# Patient Record
Sex: Female | Born: 1955
Health system: Southern US, Community
[De-identification: ages and names within clinical notes are randomized; demographics above are authoritative.]

## PROBLEM LIST (undated history)

## (undated) DIAGNOSIS — I10 Essential (primary) hypertension: Secondary | ICD-10-CM

## (undated) DIAGNOSIS — F419 Anxiety disorder, unspecified: Secondary | ICD-10-CM

## (undated) DIAGNOSIS — Z923 Personal history of irradiation: Secondary | ICD-10-CM

## (undated) DIAGNOSIS — C50919 Malignant neoplasm of unspecified site of unspecified female breast: Secondary | ICD-10-CM

## (undated) HISTORY — DX: Personal history of irradiation: Z92.3

## (undated) HISTORY — PX: TUBAL LIGATION: SHX77

## (undated) HISTORY — DX: Malignant neoplasm of unspecified site of unspecified female breast: C50.919

---

## 1998-10-12 ENCOUNTER — Other Ambulatory Visit: Admission: RE | Admit: 1998-10-12 | Discharge: 1998-10-12 | Payer: Self-pay | Admitting: Emergency Medicine

## 1999-12-02 ENCOUNTER — Encounter: Admission: RE | Admit: 1999-12-02 | Discharge: 1999-12-02 | Payer: Self-pay | Admitting: Emergency Medicine

## 1999-12-02 ENCOUNTER — Encounter: Payer: Self-pay | Admitting: Emergency Medicine

## 2000-02-15 ENCOUNTER — Other Ambulatory Visit: Admission: RE | Admit: 2000-02-15 | Discharge: 2000-02-15 | Payer: Self-pay | Admitting: Family Medicine

## 2001-01-04 ENCOUNTER — Encounter: Payer: Self-pay | Admitting: Emergency Medicine

## 2001-01-04 ENCOUNTER — Encounter: Admission: RE | Admit: 2001-01-04 | Discharge: 2001-01-04 | Payer: Self-pay | Admitting: Emergency Medicine

## 2001-07-05 ENCOUNTER — Encounter: Payer: Self-pay | Admitting: Emergency Medicine

## 2001-07-05 ENCOUNTER — Encounter: Admission: RE | Admit: 2001-07-05 | Discharge: 2001-07-05 | Payer: Self-pay | Admitting: Emergency Medicine

## 2001-11-14 ENCOUNTER — Other Ambulatory Visit: Admission: RE | Admit: 2001-11-14 | Discharge: 2001-11-14 | Payer: Self-pay | Admitting: Emergency Medicine

## 2002-02-05 ENCOUNTER — Encounter: Payer: Self-pay | Admitting: Emergency Medicine

## 2002-02-05 ENCOUNTER — Encounter: Admission: RE | Admit: 2002-02-05 | Discharge: 2002-02-05 | Payer: Self-pay | Admitting: Emergency Medicine

## 2002-02-28 HISTORY — PX: WISDOM TOOTH EXTRACTION: SHX21

## 2003-03-05 ENCOUNTER — Encounter: Admission: RE | Admit: 2003-03-05 | Discharge: 2003-03-05 | Payer: Self-pay | Admitting: Emergency Medicine

## 2003-04-16 ENCOUNTER — Other Ambulatory Visit: Admission: RE | Admit: 2003-04-16 | Discharge: 2003-04-16 | Payer: Self-pay | Admitting: Emergency Medicine

## 2004-03-25 ENCOUNTER — Encounter: Admission: RE | Admit: 2004-03-25 | Discharge: 2004-03-25 | Payer: Self-pay | Admitting: Emergency Medicine

## 2004-04-06 ENCOUNTER — Encounter: Admission: RE | Admit: 2004-04-06 | Discharge: 2004-04-06 | Payer: Self-pay | Admitting: Emergency Medicine

## 2004-04-08 ENCOUNTER — Encounter: Admission: RE | Admit: 2004-04-08 | Discharge: 2004-04-08 | Payer: Self-pay | Admitting: Surgery

## 2005-03-31 ENCOUNTER — Encounter: Admission: RE | Admit: 2005-03-31 | Discharge: 2005-03-31 | Payer: Self-pay | Admitting: Emergency Medicine

## 2005-05-04 ENCOUNTER — Other Ambulatory Visit: Admission: RE | Admit: 2005-05-04 | Discharge: 2005-05-04 | Payer: Self-pay | Admitting: Emergency Medicine

## 2005-12-15 ENCOUNTER — Encounter: Admission: RE | Admit: 2005-12-15 | Discharge: 2005-12-15 | Payer: Self-pay | Admitting: Emergency Medicine

## 2006-05-04 ENCOUNTER — Encounter: Admission: RE | Admit: 2006-05-04 | Discharge: 2006-05-04 | Payer: Self-pay | Admitting: Emergency Medicine

## 2006-07-11 ENCOUNTER — Other Ambulatory Visit: Admission: RE | Admit: 2006-07-11 | Discharge: 2006-07-11 | Payer: Self-pay | Admitting: Emergency Medicine

## 2007-04-13 ENCOUNTER — Other Ambulatory Visit: Admission: RE | Admit: 2007-04-13 | Discharge: 2007-04-13 | Payer: Self-pay | Admitting: Family Medicine

## 2007-05-08 ENCOUNTER — Encounter: Admission: RE | Admit: 2007-05-08 | Discharge: 2007-05-08 | Payer: Self-pay | Admitting: Emergency Medicine

## 2008-06-17 ENCOUNTER — Encounter: Admission: RE | Admit: 2008-06-17 | Discharge: 2008-06-17 | Payer: Self-pay | Admitting: Emergency Medicine

## 2008-12-26 ENCOUNTER — Other Ambulatory Visit: Admission: RE | Admit: 2008-12-26 | Discharge: 2008-12-26 | Payer: Self-pay | Admitting: Family Medicine

## 2009-07-16 ENCOUNTER — Encounter: Admission: RE | Admit: 2009-07-16 | Discharge: 2009-07-16 | Payer: Self-pay | Admitting: Family Medicine

## 2009-12-29 ENCOUNTER — Other Ambulatory Visit: Admission: RE | Admit: 2009-12-29 | Discharge: 2009-12-29 | Payer: Self-pay | Admitting: Family Medicine

## 2010-07-07 ENCOUNTER — Other Ambulatory Visit: Payer: Self-pay | Admitting: Family Medicine

## 2010-07-07 DIAGNOSIS — Z1231 Encounter for screening mammogram for malignant neoplasm of breast: Secondary | ICD-10-CM

## 2010-07-20 ENCOUNTER — Ambulatory Visit
Admission: RE | Admit: 2010-07-20 | Discharge: 2010-07-20 | Disposition: A | Discharge status: Home / Self Care | Payer: BC Managed Care – PPO | Source: Ambulatory Visit | Attending: Family Medicine | Admitting: Family Medicine

## 2010-07-20 DIAGNOSIS — Z1231 Encounter for screening mammogram for malignant neoplasm of breast: Secondary | ICD-10-CM

## 2010-07-21 ENCOUNTER — Other Ambulatory Visit: Payer: Self-pay | Admitting: Family Medicine

## 2010-07-21 DIAGNOSIS — R921 Mammographic calcification found on diagnostic imaging of breast: Secondary | ICD-10-CM

## 2010-07-23 ENCOUNTER — Ambulatory Visit
Admission: RE | Admit: 2010-07-23 | Discharge: 2010-07-23 | Disposition: A | Discharge status: Home / Self Care | Payer: BC Managed Care – PPO | Source: Ambulatory Visit | Attending: Family Medicine | Admitting: Family Medicine

## 2010-07-23 DIAGNOSIS — R921 Mammographic calcification found on diagnostic imaging of breast: Secondary | ICD-10-CM

## 2010-12-14 ENCOUNTER — Other Ambulatory Visit: Payer: Self-pay | Admitting: Family Medicine

## 2010-12-14 DIAGNOSIS — R921 Mammographic calcification found on diagnostic imaging of breast: Secondary | ICD-10-CM

## 2011-02-03 ENCOUNTER — Ambulatory Visit
Admission: RE | Admit: 2011-02-03 | Discharge: 2011-02-03 | Disposition: A | Discharge status: Home / Self Care | Payer: 59 | Source: Ambulatory Visit | Attending: Family Medicine | Admitting: Family Medicine

## 2011-02-03 DIAGNOSIS — R921 Mammographic calcification found on diagnostic imaging of breast: Secondary | ICD-10-CM

## 2011-06-24 ENCOUNTER — Other Ambulatory Visit: Payer: Self-pay | Admitting: Family Medicine

## 2011-06-24 DIAGNOSIS — Z1231 Encounter for screening mammogram for malignant neoplasm of breast: Secondary | ICD-10-CM

## 2011-07-28 ENCOUNTER — Ambulatory Visit
Admission: RE | Admit: 2011-07-28 | Discharge: 2011-07-28 | Disposition: A | Discharge status: Home / Self Care | Payer: 59 | Source: Ambulatory Visit | Attending: Family Medicine | Admitting: Family Medicine

## 2011-07-28 ENCOUNTER — Other Ambulatory Visit: Payer: Self-pay | Admitting: Family Medicine

## 2011-07-28 DIAGNOSIS — R921 Mammographic calcification found on diagnostic imaging of breast: Secondary | ICD-10-CM

## 2011-07-28 DIAGNOSIS — Z1231 Encounter for screening mammogram for malignant neoplasm of breast: Secondary | ICD-10-CM

## 2011-08-04 ENCOUNTER — Ambulatory Visit
Admission: RE | Admit: 2011-08-04 | Discharge: 2011-08-04 | Disposition: A | Discharge status: Home / Self Care | Payer: 59 | Source: Ambulatory Visit | Attending: Family Medicine | Admitting: Family Medicine

## 2011-08-04 DIAGNOSIS — R921 Mammographic calcification found on diagnostic imaging of breast: Secondary | ICD-10-CM

## 2011-09-27 ENCOUNTER — Other Ambulatory Visit: Payer: Self-pay | Admitting: Family Medicine

## 2011-09-27 ENCOUNTER — Other Ambulatory Visit (HOSPITAL_COMMUNITY)
Admission: RE | Admit: 2011-09-27 | Discharge: 2011-09-27 | Disposition: A | Discharge status: Home / Self Care | Payer: 59 | Source: Ambulatory Visit | Attending: Family Medicine | Admitting: Family Medicine

## 2011-09-27 DIAGNOSIS — Z1151 Encounter for screening for human papillomavirus (HPV): Secondary | ICD-10-CM | POA: Insufficient documentation

## 2011-09-27 DIAGNOSIS — Z124 Encounter for screening for malignant neoplasm of cervix: Secondary | ICD-10-CM | POA: Insufficient documentation

## 2012-02-29 DIAGNOSIS — Z923 Personal history of irradiation: Secondary | ICD-10-CM

## 2012-02-29 HISTORY — DX: Personal history of irradiation: Z92.3

## 2012-05-22 ENCOUNTER — Other Ambulatory Visit: Payer: Self-pay | Admitting: Family Medicine

## 2012-05-22 DIAGNOSIS — R921 Mammographic calcification found on diagnostic imaging of breast: Secondary | ICD-10-CM

## 2012-07-29 DIAGNOSIS — C50919 Malignant neoplasm of unspecified site of unspecified female breast: Secondary | ICD-10-CM

## 2012-07-29 HISTORY — DX: Malignant neoplasm of unspecified site of unspecified female breast: C50.919

## 2012-08-07 ENCOUNTER — Other Ambulatory Visit: Payer: Self-pay | Admitting: Family Medicine

## 2012-08-07 ENCOUNTER — Ambulatory Visit
Admission: RE | Admit: 2012-08-07 | Discharge: 2012-08-07 | Disposition: A | Discharge status: Home / Self Care | Payer: 59 | Source: Ambulatory Visit | Attending: Family Medicine | Admitting: Family Medicine

## 2012-08-07 DIAGNOSIS — R921 Mammographic calcification found on diagnostic imaging of breast: Secondary | ICD-10-CM

## 2012-08-08 ENCOUNTER — Other Ambulatory Visit: Payer: Self-pay | Admitting: Family Medicine

## 2012-08-08 ENCOUNTER — Ambulatory Visit
Admission: RE | Admit: 2012-08-08 | Discharge: 2012-08-08 | Disposition: A | Discharge status: Home / Self Care | Payer: 59 | Source: Ambulatory Visit | Attending: Family Medicine | Admitting: Family Medicine

## 2012-08-08 DIAGNOSIS — R921 Mammographic calcification found on diagnostic imaging of breast: Secondary | ICD-10-CM

## 2012-08-08 DIAGNOSIS — C50912 Malignant neoplasm of unspecified site of left female breast: Secondary | ICD-10-CM

## 2012-08-10 ENCOUNTER — Ambulatory Visit (INDEPENDENT_AMBULATORY_CARE_PROVIDER_SITE_OTHER): Payer: 59 | Admitting: Surgery

## 2012-08-10 ENCOUNTER — Encounter (INDEPENDENT_AMBULATORY_CARE_PROVIDER_SITE_OTHER): Payer: Self-pay | Admitting: Surgery

## 2012-08-10 ENCOUNTER — Other Ambulatory Visit (INDEPENDENT_AMBULATORY_CARE_PROVIDER_SITE_OTHER): Payer: Self-pay

## 2012-08-10 VITALS — BP 124/72 | HR 78 | Temp 98.4°F | Resp 15 | Ht 64.5 in | Wt 140.8 lb

## 2012-08-10 DIAGNOSIS — C50912 Malignant neoplasm of unspecified site of left female breast: Secondary | ICD-10-CM

## 2012-08-10 DIAGNOSIS — C50919 Malignant neoplasm of unspecified site of unspecified female breast: Secondary | ICD-10-CM

## 2012-08-10 NOTE — Patient Instructions (Signed)
I will talk with you again next Friday about the results of your other tests. We will put in a request for a genetic counselor to assess your risk to have a familial type of breast cancer

## 2012-08-10 NOTE — Progress Notes (Signed)
Patient ID: Karen Lawrence, female   DOB: 1955/08/11, 57 y.o.   MRN: [id number]  Chief Complaint  Patient presents with  . Breast Cancer    Left    HPI Karen Lawrence is a 57 y.o. female.  She had an area of increasingly suspicious calcifications in the left breast and a needle core biopsy has shown DCIS with calcifications. Receptor status is pending. The calcifications apparently her over about 11 cm area. MRI is pending. She is asymptomatic. She has a strong family history of breast cancer, and she does not believe anyone has been tested for genetic abnormalities thus far.  HPI  History reviewed. No pertinent past medical history.  History reviewed. No pertinent past surgical history.  Family History  Problem Relation Age of Onset  . Cancer Mother   . Cancer Father   . Diabetes Father   . Cancer Sister   . Cancer Sister   . Cancer Sister   . Diabetes Sister     Social History History  Substance Use Topics  . Smoking status: Never Smoker   . Smokeless tobacco: Never Used  . Alcohol Use: No    No Known Allergies  Current Outpatient Prescriptions  Medication Sig Dispense Refill  . triamterene-hydrochlorothiazide (MAXZIDE-25) 37.5-25 MG per tablet        No current facility-administered medications for this visit.    Review of Systems Review of Systems  Constitutional: Negative for fever, chills and unexpected weight change.  HENT: Negative for hearing loss, congestion, sore throat, trouble swallowing and voice change.   Eyes: Negative for visual disturbance.  Respiratory: Negative for cough and wheezing.   Cardiovascular: Negative for chest pain, palpitations and leg swelling.  Gastrointestinal: Negative for nausea, vomiting, abdominal pain, diarrhea, constipation, blood in stool, abdominal distention and anal bleeding.  Genitourinary: Negative for hematuria, vaginal bleeding and difficulty urinating.  Musculoskeletal: Negative for arthralgias.  Skin: Negative  for rash and wound.  Neurological: Negative for seizures, syncope and headaches.  Hematological: Negative for adenopathy. Does not bruise/bleed easily.  Psychiatric/Behavioral: Negative for confusion.    Blood pressure 124/72, pulse 78, temperature 98.4 F (36.9 C), temperature source Temporal, resp. rate 15, height 5' 4.5" (1.638 m), weight 140 lb 12.8 oz (63.866 kg).  Physical Exam Physical Exam  Vitals reviewed. Constitutional: She is oriented to person, place, and time. She appears well-developed and well-nourished. No distress.  HENT:  Head: Normocephalic and atraumatic.  Mouth/Throat: Oropharynx is clear and moist.  Eyes: Conjunctivae and EOM are normal. Pupils are equal, round, and reactive to light. No scleral icterus.  Neck: Normal range of motion. Neck supple. No tracheal deviation present. No thyromegaly present.  Cardiovascular: Normal rate, regular rhythm, normal heart sounds and intact distal pulses.  Exam reveals no gallop and no friction rub.   No murmur heard. Pulmonary/Chest: Effort normal and breath sounds normal. No respiratory distress. She has no wheezes. She has no rales.  Abdominal: Soft. Bowel sounds are normal. She exhibits no distension and no mass. There is no tenderness. There is no rebound and no guarding.  Musculoskeletal: Normal range of motion. She exhibits no edema and no tenderness.  Lymphadenopathy:    She has no cervical adenopathy.    She has no axillary adenopathy.       Right: No supraclavicular adenopathy present.       Left: No supraclavicular adenopathy present.  Neurological: She is alert and oriented to person, place, and time.  Skin: Skin is warm and dry.  No rash noted. She is not diaphoretic. No erythema.  Psychiatric: She has a normal mood and affect. Her behavior is normal. Judgment and thought content normal.    Data Reviewed Path: Diagnosis Breast, left, needle core biopsy, LOQ - DUCTAL CARCINOMA IN SITU WITH CALCIFICATIONS. -  SEE COMMENT. Microscopic Comment The carcinoma appears intermediate grade. Estrogen receptor and progesterone receptor studies will be performed and the results reported separately. The result were called to [facility] on 08/08/12. (JBK:gt, 08/08/12) Pecola Leisure MD Pathologist, Electronic  Assessment    Clinical stage 0 left breast cancer lower outer quadrant, MRI pending Strong family history for breast cancer (3 sisters, mother)     Plan    I have explained the pathophysiology and staging of breast cancer with particular attention to her exact situation. We discussed the multidisciplinary approach to breast cancer which often includes both medical and radiation oncology consultations.  We also discussed surgical options for the treatment of breast cancer including lumpectomy and mastectomy with possible reconstructive surgery. In addition we talked about the evaluation and management of lymph nodes including a description of sentinel lymph node biopsy and axillary dissections. We reviewed potential complications and risks including bleeding, infection, numbness,  lymphedema, and the potential need for additional surgery.  She understands that for patients who are candidate for lumpectomy or mastectomy there is an equal survival rate with either technique, but a slightly higher local recurrence rate with lumpectomy. In addition she knows that a lumpectomy usually requires postoperative radiation as part of the management of the breast cancer.  We have discussed the likely postoperative course and plans for followup.  I have given the patient some written information that reviewed all of these issues. I believe her questions are answered and that she has a good understanding of the issues.  Her MRI and prognostic indicator panel is still pending. She'll be presented at the breast cancer conference on Wednesday and I will see her on Friday when we have all the information  available. In the meantime, we will go ahead and make a genetic counselor appointment for genetic counseling.         Canaan Holzer J 08/10/2012, 9:04 AM

## 2012-08-10 NOTE — Addendum Note (Signed)
Addended byLiliana Cline on: 08/10/2012 09:41 AM   Modules accepted: Orders

## 2012-08-13 ENCOUNTER — Ambulatory Visit
Admission: RE | Admit: 2012-08-13 | Discharge: 2012-08-13 | Disposition: A | Discharge status: Home / Self Care | Payer: 59 | Source: Ambulatory Visit | Attending: Family Medicine | Admitting: Family Medicine

## 2012-08-13 DIAGNOSIS — C50912 Malignant neoplasm of unspecified site of left female breast: Secondary | ICD-10-CM

## 2012-08-13 MED ORDER — GADOBENATE DIMEGLUMINE 529 MG/ML IV SOLN
12.0000 mL | Freq: Once | INTRAVENOUS | Status: AC | PRN
  Administered 2012-08-13: 12 mL via INTRAVENOUS

## 2012-08-15 ENCOUNTER — Telehealth (INDEPENDENT_AMBULATORY_CARE_PROVIDER_SITE_OTHER): Payer: Self-pay | Admitting: General Surgery

## 2012-08-15 ENCOUNTER — Other Ambulatory Visit: Payer: Self-pay | Admitting: Family Medicine

## 2012-08-15 DIAGNOSIS — R928 Other abnormal and inconclusive findings on diagnostic imaging of breast: Secondary | ICD-10-CM

## 2012-08-15 NOTE — Telephone Encounter (Signed)
Patient called in crying and very upset with questions regarding her MRI. Patient stated Olegario Messier from the [facility] called her and gave her a results. I contacted Streck,MD and followed instructions to call patient back and relay the following: -the area seen on the MRI was the same area/same breast that was seen on the Mammogram. -area is about 4 inches away from other area. -just need to see if area is benign or malignant. -will sit down with patient and her Husband on 08/15/2012 at 12:30 to discuss plan. Streck,MD did ask the [facility] to not call patient today with results.

## 2012-08-17 ENCOUNTER — Ambulatory Visit (INDEPENDENT_AMBULATORY_CARE_PROVIDER_SITE_OTHER): Payer: 59 | Admitting: Surgery

## 2012-08-17 ENCOUNTER — Telehealth: Payer: Self-pay | Admitting: *Deleted

## 2012-08-17 ENCOUNTER — Other Ambulatory Visit (INDEPENDENT_AMBULATORY_CARE_PROVIDER_SITE_OTHER): Payer: Self-pay

## 2012-08-17 ENCOUNTER — Encounter (INDEPENDENT_AMBULATORY_CARE_PROVIDER_SITE_OTHER): Payer: Self-pay | Admitting: Surgery

## 2012-08-17 VITALS — BP 136/80 | HR 70 | Temp 98.4°F | Resp 15 | Ht 64.5 in | Wt 140.8 lb

## 2012-08-17 DIAGNOSIS — C50919 Malignant neoplasm of unspecified site of unspecified female breast: Secondary | ICD-10-CM

## 2012-08-17 DIAGNOSIS — Z803 Family history of malignant neoplasm of breast: Secondary | ICD-10-CM

## 2012-08-17 DIAGNOSIS — C50912 Malignant neoplasm of unspecified site of left female breast: Secondary | ICD-10-CM

## 2012-08-17 NOTE — Telephone Encounter (Signed)
Confirmed 08/20/12 genetic appt w/ pt.  Emailed Clydie Braun to make aware.  Emailed Jade at [facility] to make aware.

## 2012-08-17 NOTE — Patient Instructions (Signed)
We will followup with you once the report from next week biopsy is available. We are checking to see if we can expedite the appointment with the genetic counselor

## 2012-08-17 NOTE — Progress Notes (Signed)
Karen Lawrence       DOB: 1955-03-11           DATE: 08/17/2012       ZOX:[id number]  CC:  Chief Complaint  Patient presents with  . Follow-up    f/u breast ca mri    HPI: she comes back for counseling following her MRI and presentation at the multidisciplinary breast clinic. She has noticed some bruising at the area of her original needle biopsy. Her original biopsy showed DCIS. Her mammogram showed about 11 cm of calcifications suspicious for DCIS. The MRI shows 13 cm. She is very active in breast preservation and we need to do a biopsy to see the extent of the disease I discussed at length with her husband. We put in a request for a genetic counselor because she is such a strong family history and this may make our decision for surgery different. That has not been done and I discussed that with her again as well.  EXAM: Vital signs: BP 136/80  Pulse 70  Temp(Src) 98.4 F (36.9 C) (Temporal)  Resp 15  Ht 5' 4.5" (1.638 m)  Wt 140 lb 12.8 oz (63.866 kg)  BMI 23.8 kg/m2  General: Patient alert, oriented, NAD  Left breast: There is a small bruise in the lower outer quadrant from the biopsy. There is no other abnormality noted. IMP: clinical stage 0 left breast cancer lower outer quadrant and extending over a distance of about 13 cm; strong family history for breast cancer.  PLAN: I have spent a long time discussing the situation with the patient and her husband. She is scheduled for an MRI guided biopsy this coming Friday. We are also going to recheck on the headache counseling referral. Once we have this information we can make further plans. The patient has very large breasts and I think we could successfully excised a 12 or 13 cm area with acceptable cosmetic results. However, she has a genetic abnormality she may wish to consider mastectomy and reconstructions. I told her she will need radiation therapy with lumpectomy, but not with a mastectomy. I also told her she should not  need chemotherapy if final pathology confirms that there is no invasive breast cancer.  Shawntavia Saunders J 08/17/2012

## 2012-08-20 ENCOUNTER — Encounter: Payer: Self-pay | Admitting: Genetic Counselor

## 2012-08-20 ENCOUNTER — Other Ambulatory Visit: Payer: 59

## 2012-08-20 ENCOUNTER — Ambulatory Visit (HOSPITAL_BASED_OUTPATIENT_CLINIC_OR_DEPARTMENT_OTHER): Payer: 59 | Admitting: Genetic Counselor

## 2012-08-20 DIAGNOSIS — D0512 Intraductal carcinoma in situ of left breast: Secondary | ICD-10-CM

## 2012-08-20 DIAGNOSIS — D0590 Unspecified type of carcinoma in situ of unspecified breast: Secondary | ICD-10-CM

## 2012-08-20 DIAGNOSIS — IMO0002 Reserved for concepts with insufficient information to code with codable children: Secondary | ICD-10-CM

## 2012-08-20 DIAGNOSIS — Z803 Family history of malignant neoplasm of breast: Secondary | ICD-10-CM

## 2012-08-20 NOTE — Progress Notes (Signed)
Dr.  Cyndia Bent requested a consultation for genetic counseling and risk assessment for Karen Lawrence, a 56 y.o. female, for discussion of her personal history of breast cancer and family history of breast and prostate cancer.  She presents to clinic today to discuss the possibility of a genetic predisposition to cancer, and to further clarify her risks, as well as her family members' risks for cancer.   HISTORY OF PRESENT ILLNESS: In 2014, at the age of 61, Karen Lawrence was diagnosed with DCIS of the left breast. This is planning to be treated with lumpectomy and radiation.  The prognostic factors of the tumor are still pending.  Layan reports that two of her sisters were tested for "the gene" and did not have it.    Past Medical History  Diagnosis Date  . Breast cancer 07/2012    DCIS    History reviewed. No pertinent past surgical history.  History   Social History  . Marital Status: Married    Spouse Name: N/A    Number of Children: 2  . Years of Education: N/A   Social History Main Topics  . Smoking status: Never Smoker   . Smokeless tobacco: Never Used  . Alcohol Use: No  . Drug Use: No  . Sexually Active: None   Other Topics Concern  . None   Social History Narrative  . None    REPRODUCTIVE HISTORY AND PERSONAL RISK ASSESSMENT FACTORS: Menarche was at age 7.   postmenopausal Uterus Intact: yes Ovaries Intact: yes G2P2A0, first live birth at age 67  She has not previously undergone treatment for infertility.   Oral Contraceptive use: 8 years   She has not used HRT in the past.    FAMILY HISTORY:  We obtained a detailed, 4-generation family history.  Significant diagnoses are listed below: Family History  Problem Relation Age of Onset  . Breast cancer Mother 47  . Cancer Father   . Diabetes Father   . Breast cancer Sister 35  . Breast cancer Sister 55  . Breast cancer Sister 21  . Diabetes Sister   . Prostate cancer Brother 35  . HIV/AIDS  Brother   . Uterine cancer Paternal Aunt     dx in her mid 46s  The patient has 13 brothers and sisters.  One brother died in infancy, and one brother is a paternal half brother.  Three full sisters developed breast cancer at ages 41, 81 and 25.  One brother developed prostate cancer at 32.  The patient's mother had breast cancer at age 1 and died at 15.  Patient's maternal ancestors are of African American descent, and paternal ancestors are of African American descent. There is no reported Ashkenazi Jewish ancestry. There is no  known consanguinity.  GENETIC COUNSELING ASSESSMENT: [name] is a 57 y.o. female with a personal history of DCIS breast cancer and family history of breast and prostate cancer which somewhat suggestive of a hereditary breast and ovarian cancer syndrome and predisposition to cancer. She reports that her two youngest sisters, who are the most informative in the family to test as they developed breast cancer at the earliest ages, were negative when when they had genetic testing.  This decreases the likelihood that the patient will test positive. We, therefore, discussed and recommended the following at today's visit.   DISCUSSION: We reviewed the characteristics, features and inheritance patterns of hereditary cancer syndromes. We also discussed genetic testing, including the appropriate family members  to test, the process of testing, insurance coverage and turn-around-time for results. We recommended Karen Lawrence pursue genetic testing for multiple breast cancer genes at [facility], as she has multiple family members with breast cancer, two of whom have tested negative for, most likely, BRCA mutations. [name] only wanted to pursue genetic testing for BRCA mutations, and not a larger panel.  She realizes that there is a chance that we may not be testing for the right gene.  PLAN: After considering the risks, benefits, and limitations, Karen Lawrence provided  informed consent to pursue genetic testing and the blood sample will be sent to [facility] for analysis of the BRCA genes. We discussed the implications of a positive, negative and/ or variant of uncertain significance genetic test result. Results should be available within approximately 1-2 weeks' time, at which point they will be disclosed by telephone to Karen Lawrence, as will any additional her recommendations warranted by these results. Karen Lawrence will receive a summary of her genetic counseling visit and a copy of her results once available. This information will also be available in Epic. We encouraged Karen Lawrence to remain in contact with cancer genetics annually so that we can continuously update the family history and inform her of any changes in cancer genetics and testing that may be of benefit for her family. Karen Lawrence's questions were answered to her satisfaction today. Our contact information was provided should additional questions or concerns arise.  Per the patient's request, we will contact her by telephone to discuss these results. A follow up genetic counseling visit will be scheduled if indicated.  The patient was seen for a total of 60 minutes, greater than 50% of which was spent face-to-face counseling.  This plan is being carried out per Dr. Feliz Beam recommendations.  This note will also be sent to the referring provider via the electronic medical record. The patient will be supplied with a summary of this genetic counseling discussion as well as educational information on the discussed hereditary cancer syndromes following the conclusion of their visit.   Patient was discussed with Dr. Drue Second.   _______________________________________________________________________ For Office Staff:  Number of people involved in session: 1 Was an Intern/ student involved with case: no

## 2012-08-23 ENCOUNTER — Telehealth (INDEPENDENT_AMBULATORY_CARE_PROVIDER_SITE_OTHER): Payer: Self-pay

## 2012-08-23 NOTE — Telephone Encounter (Signed)
Pt called to notify [facility] / Dr Jamey Ripa that Dr. Azucena Kuba had changed her biopsy appt from June 27 to July 3.  Pt will contact Dr Azucena Kuba Monday July 7 as well as our office to "keep everyone on the same page"

## 2012-08-28 ENCOUNTER — Telehealth: Payer: Self-pay | Admitting: Genetic Counselor

## 2012-08-28 NOTE — Telephone Encounter (Signed)
Revealed negative genetic for BRCA genes

## 2012-08-29 ENCOUNTER — Encounter: Payer: Self-pay | Admitting: Genetic Counselor

## 2012-08-30 ENCOUNTER — Ambulatory Visit
Admission: RE | Admit: 2012-08-30 | Discharge: 2012-08-30 | Disposition: A | Discharge status: Home / Self Care | Payer: 59 | Source: Ambulatory Visit | Attending: Family Medicine | Admitting: Family Medicine

## 2012-08-30 DIAGNOSIS — R928 Other abnormal and inconclusive findings on diagnostic imaging of breast: Secondary | ICD-10-CM

## 2012-09-03 ENCOUNTER — Telehealth (INDEPENDENT_AMBULATORY_CARE_PROVIDER_SITE_OTHER): Payer: Self-pay | Admitting: General Surgery

## 2012-09-03 NOTE — Telephone Encounter (Signed)
I called patient - per Dr Jamey Ripa - to see if she would like him to call her to discuss 2nd biopsy results/surgery. I called patient and she would like to speak to him in person. Appt moved to Wednesday pm after the conference.

## 2012-09-03 NOTE — Telephone Encounter (Signed)
Karen Lawrence with [facility] called to let us know patient had her 2nd biopsy that was positive for DCIS - approximately 8.5 cm away from previous DCIS site in left breast. They are going to call patient with results today.

## 2012-09-04 ENCOUNTER — Other Ambulatory Visit (INDEPENDENT_AMBULATORY_CARE_PROVIDER_SITE_OTHER): Payer: Self-pay | Admitting: Surgery

## 2012-09-04 ENCOUNTER — Telehealth (INDEPENDENT_AMBULATORY_CARE_PROVIDER_SITE_OTHER): Payer: Self-pay | Admitting: Surgery

## 2012-09-04 DIAGNOSIS — C50912 Malignant neoplasm of unspecified site of left female breast: Secondary | ICD-10-CM

## 2012-09-04 NOTE — Telephone Encounter (Signed)
Called her to discuss bx report = it shows more DCIS and she will need a very large lumpectomy. That is what she wishes to do. Will try to schedule for next week and she will see me in the office tomorrow for further review and plans

## 2012-09-05 ENCOUNTER — Ambulatory Visit (INDEPENDENT_AMBULATORY_CARE_PROVIDER_SITE_OTHER): Payer: 59 | Admitting: Surgery

## 2012-09-05 ENCOUNTER — Encounter (HOSPITAL_COMMUNITY): Payer: Self-pay | Admitting: Respiratory Therapy

## 2012-09-05 ENCOUNTER — Encounter (INDEPENDENT_AMBULATORY_CARE_PROVIDER_SITE_OTHER): Payer: Self-pay | Admitting: Surgery

## 2012-09-05 VITALS — BP 130/82 | HR 88 | Temp 98.5°F | Resp 14 | Ht 64.5 in | Wt 139.4 lb

## 2012-09-05 DIAGNOSIS — C50919 Malignant neoplasm of unspecified site of unspecified female breast: Secondary | ICD-10-CM

## 2012-09-05 DIAGNOSIS — C50912 Malignant neoplasm of unspecified site of left female breast: Secondary | ICD-10-CM

## 2012-09-05 NOTE — Patient Instructions (Signed)
I will see you for surgery on Tuesday at [facility].

## 2012-09-05 NOTE — Progress Notes (Signed)
Karen Lawrence       DOB: [birth year]           DATE: 09/05/2012       WNU:[id number]  CC:  Chief Complaint  Patient presents with  . Breast Cancer    s/p 2nd biopsy    HPI: she comes back for further evaluation discussion following a second needle core biopsy of the left breast. Her mammogram showed a large area of calcifications extending from near the areola out laterally and posteriorly and inferiorly. A second biopsy was done at the lateral aspect is the first was done at the medial aspect and a second biopsy confirmed DCIS. She'll need an extensive lumpectomy to excise entire knee.  EXAM: Vital signs: BP 130/82  Pulse 88  Temp(Src) 98.5 F (36.9 C) (Temporal)  Resp 14  Ht 5' 4.5" (1.638 m)  Wt 139 lb 6.4 oz (63.231 kg)  BMI 23.57 kg/m2  General: Patient alert, oriented, NAD  Breasts: She has no evidence of hematoma from her more recent biopsy. IMP: Left breast cancer large area of DCIS  PLAN: We will proceed with NL lumpectomy which we discussed at length today. She will need to have the area bracketed. We discussed the potential of + margin and need for second excision and or mastectomy. We also discussed again the potential deformity of the breast. All questions answered. Outlined for her the extent of the incision and described the surgery in a fair amount of detail.  Irish Piech J 09/05/2012

## 2012-09-07 ENCOUNTER — Encounter (INDEPENDENT_AMBULATORY_CARE_PROVIDER_SITE_OTHER): Payer: 59 | Admitting: Surgery

## 2012-09-10 ENCOUNTER — Encounter (HOSPITAL_COMMUNITY)
Admission: RE | Admit: 2012-09-10 | Discharge: 2012-09-10 | Disposition: A | Discharge status: Home / Self Care | Payer: 59 | Source: Ambulatory Visit | Attending: Surgery | Admitting: Surgery

## 2012-09-10 ENCOUNTER — Encounter (HOSPITAL_COMMUNITY): Payer: Self-pay

## 2012-09-10 HISTORY — DX: Essential (primary) hypertension: I10

## 2012-09-10 LAB — COMPREHENSIVE METABOLIC PANEL
ALT: 26 U/L (ref 0–35)
AST: 30 U/L (ref 0–37)
Albumin: 4.3 g/dL (ref 3.5–5.2)
Alkaline Phosphatase: 59 U/L (ref 39–117)
BUN: 18 mg/dL (ref 6–23)
Chloride: 101 mEq/L (ref 96–112)
Potassium: 3.6 mEq/L (ref 3.5–5.1)
Sodium: 136 mEq/L (ref 135–145)
Total Bilirubin: 0.4 mg/dL (ref 0.3–1.2)

## 2012-09-10 LAB — CBC WITH DIFFERENTIAL/PLATELET
Basophils Relative: 0 % (ref 0–1)
Hemoglobin: 12.9 g/dL (ref 12.0–15.0)
MCHC: 33.9 g/dL (ref 30.0–36.0)
Monocytes Relative: 10 % (ref 3–12)
Neutro Abs: 2.6 10*3/uL (ref 1.7–7.7)
Neutrophils Relative %: 46 % (ref 43–77)
RBC: 4.25 MIL/uL (ref 3.87–5.11)

## 2012-09-10 MED ORDER — CEFAZOLIN SODIUM-DEXTROSE 2-3 GM-% IV SOLR
2.0000 g | INTRAVENOUS | Status: AC
  Administered 2012-09-11: 2 g via INTRAVENOUS
  Filled 2012-09-10: qty 50

## 2012-09-10 NOTE — Pre-Procedure Instructions (Signed)
KIMBLEY SPRAGUE  09/10/2012   Your procedure is scheduled on:  Tuesday, July 15  Report to [facility] at 0730 AM.  Call this number if you have problems the morning of surgery: [phone]   Remember:   Do not eat food or drink liquids after midnight Monday night.   Take these medicines the morning of surgery with A SIP OF WATER: none   Do not wear jewelry, make-up or nail polish.  Do not wear lotions, powders, or perfumes. You may wear deodorant.  Do not shave 48 hours prior to surgery.  Do not bring valuables to the hospital.  [facility] is not responsible  for any belongings or valuables.  Contacts, dentures or bridgework may not be worn into surgery.     Patients discharged the day of surgery will not be allowed to drive home.  Name and phone number of your driver:    Special Instructions: Shower using CHG 2 nights before surgery and the night before surgery.  If you shower the day of surgery use CHG.  Use special wash - you have one bottle of CHG for all showers.  You should use approximately 1/3 of the bottle for each shower.   Please read over the following fact sheets that you were given: Pain Booklet, Coughing and Deep Breathing and Surgical Site Infection Prevention

## 2012-09-11 ENCOUNTER — Ambulatory Visit
Admission: RE | Admit: 2012-09-11 | Discharge: 2012-09-11 | Disposition: A | Discharge status: Home / Self Care | Payer: 59 | Source: Ambulatory Visit | Attending: Surgery | Admitting: Surgery

## 2012-09-11 ENCOUNTER — Encounter (HOSPITAL_COMMUNITY): Payer: Self-pay | Admitting: Certified Registered"

## 2012-09-11 ENCOUNTER — Ambulatory Visit (HOSPITAL_COMMUNITY)
Admission: RE | Admit: 2012-09-11 | Discharge: 2012-09-11 | Disposition: A | Discharge status: Home / Self Care | Payer: 59 | Source: Ambulatory Visit | Attending: Surgery | Admitting: Surgery

## 2012-09-11 ENCOUNTER — Encounter (HOSPITAL_COMMUNITY)
Admission: RE | Disposition: A | Discharge status: Home / Self Care | Payer: Self-pay | Source: Ambulatory Visit | Attending: Surgery

## 2012-09-11 ENCOUNTER — Ambulatory Visit (HOSPITAL_COMMUNITY): Payer: 59 | Admitting: Certified Registered"

## 2012-09-11 DIAGNOSIS — C50912 Malignant neoplasm of unspecified site of left female breast: Secondary | ICD-10-CM

## 2012-09-11 DIAGNOSIS — D0590 Unspecified type of carcinoma in situ of unspecified breast: Secondary | ICD-10-CM

## 2012-09-11 DIAGNOSIS — I10 Essential (primary) hypertension: Secondary | ICD-10-CM | POA: Insufficient documentation

## 2012-09-11 DIAGNOSIS — Z79899 Other long term (current) drug therapy: Secondary | ICD-10-CM | POA: Insufficient documentation

## 2012-09-11 HISTORY — PX: BREAST LUMPECTOMY WITH NEEDLE LOCALIZATION: SHX5759

## 2012-09-11 SURGERY — BREAST LUMPECTOMY WITH NEEDLE LOCALIZATION
Anesthesia: General | Site: Breast | Laterality: Left | Wound class: Clean

## 2012-09-11 MED ORDER — OXYCODONE-ACETAMINOPHEN 5-325 MG PO TABS: 1.0000 | ORAL_TABLET | ORAL | Status: DC | PRN

## 2012-09-11 MED ORDER — ONDANSETRON HCL 4 MG/2ML IJ SOLN
INTRAMUSCULAR | Status: AC
  Filled 2012-09-11: qty 2

## 2012-09-11 MED ORDER — METOCLOPRAMIDE HCL 10 MG/10ML PO SOLN: 10.0000 mg | Freq: Three times a day (TID) | ORAL | Status: DC

## 2012-09-11 MED ORDER — ONDANSETRON HCL 4 MG/2ML IJ SOLN
INTRAMUSCULAR | Status: DC | PRN
  Administered 2012-09-11: 4 mg via INTRAVENOUS

## 2012-09-11 MED ORDER — METOCLOPRAMIDE HCL 5 MG/ML IJ SOLN
INTRAMUSCULAR | Status: AC
  Administered 2012-09-11: 10 mg
  Filled 2012-09-11: qty 2

## 2012-09-11 MED ORDER — LACTATED RINGERS IV SOLN
INTRAVENOUS | Status: DC
  Administered 2012-09-11 (×2): via INTRAVENOUS

## 2012-09-11 MED ORDER — BUPIVACAINE HCL (PF) 0.25 % IJ SOLN
INTRAMUSCULAR | Status: DC | PRN
  Administered 2012-09-11: 30 mL

## 2012-09-11 MED ORDER — CHLORHEXIDINE GLUCONATE 4 % EX LIQD: 1.0000 "application " | Freq: Once | CUTANEOUS | Status: DC

## 2012-09-11 MED ORDER — BUPIVACAINE HCL (PF) 0.25 % IJ SOLN
INTRAMUSCULAR | Status: AC
  Filled 2012-09-11: qty 30

## 2012-09-11 MED ORDER — OXYCODONE HCL 5 MG/5ML PO SOLN: 5.0000 mg | Freq: Once | ORAL | Status: DC | PRN

## 2012-09-11 MED ORDER — OXYCODONE HCL 5 MG PO TABS: 5.0000 mg | ORAL_TABLET | Freq: Once | ORAL | Status: DC | PRN

## 2012-09-11 MED ORDER — METOCLOPRAMIDE HCL 5 MG/ML IJ SOLN
10.0000 mg | Freq: Once | INTRAMUSCULAR | Status: AC | PRN
  Administered 2012-09-11: 10 mg via INTRAVENOUS

## 2012-09-11 MED ORDER — DEXAMETHASONE SODIUM PHOSPHATE 4 MG/ML IJ SOLN
INTRAMUSCULAR | Status: DC | PRN
  Administered 2012-09-11: 8 mg via INTRAVENOUS

## 2012-09-11 MED ORDER — PROPOFOL 10 MG/ML IV BOLUS
INTRAVENOUS | Status: DC | PRN
  Administered 2012-09-11: 200 mg via INTRAVENOUS

## 2012-09-11 MED ORDER — PHENYLEPHRINE HCL 10 MG/ML IJ SOLN
INTRAMUSCULAR | Status: DC | PRN
  Administered 2012-09-11 (×5): 80 ug via INTRAVENOUS

## 2012-09-11 MED ORDER — 0.9 % SODIUM CHLORIDE (POUR BTL) OPTIME
TOPICAL | Status: DC | PRN
  Administered 2012-09-11: 1000 mL

## 2012-09-11 MED ORDER — ONDANSETRON HCL 4 MG/2ML IJ SOLN
4.0000 mg | Freq: Once | INTRAMUSCULAR | Status: AC
  Administered 2012-09-11: 4 mg via INTRAVENOUS

## 2012-09-11 MED ORDER — HYDROMORPHONE HCL PF 1 MG/ML IJ SOLN
INTRAMUSCULAR | Status: AC
  Filled 2012-09-11: qty 1

## 2012-09-11 MED ORDER — LIDOCAINE HCL (CARDIAC) 20 MG/ML IV SOLN
INTRAVENOUS | Status: DC | PRN
  Administered 2012-09-11: 100 mg via INTRAVENOUS

## 2012-09-11 MED ORDER — FENTANYL CITRATE 0.05 MG/ML IJ SOLN
INTRAMUSCULAR | Status: DC | PRN
  Administered 2012-09-11: 25 ug via INTRAVENOUS
  Administered 2012-09-11: 50 ug via INTRAVENOUS
  Administered 2012-09-11: 75 ug via INTRAVENOUS

## 2012-09-11 MED ORDER — HYDROMORPHONE HCL PF 1 MG/ML IJ SOLN
0.2500 mg | INTRAMUSCULAR | Status: DC | PRN
  Administered 2012-09-11 (×2): 0.25 mg via INTRAVENOUS

## 2012-09-11 MED ORDER — MIDAZOLAM HCL 5 MG/5ML IJ SOLN
INTRAMUSCULAR | Status: DC | PRN
  Administered 2012-09-11: 2 mg via INTRAVENOUS

## 2012-09-11 MED ORDER — METOCLOPRAMIDE HCL 5 MG/ML IJ SOLN
INTRAMUSCULAR | Status: AC
  Filled 2012-09-11: qty 2

## 2012-09-11 MED ORDER — EPHEDRINE SULFATE 50 MG/ML IJ SOLN
INTRAMUSCULAR | Status: DC | PRN
  Administered 2012-09-11: 10 mg via INTRAVENOUS

## 2012-09-11 SURGICAL SUPPLY — 45 items
APPLIER CLIP 9.375 MED OPEN (MISCELLANEOUS)
APPLIER CLIP 9.375 SM OPEN (CLIP) ×2
APR CLP MED 9.3 20 MLT OPN (MISCELLANEOUS)
BINDER BREAST LRG (GAUZE/BANDAGES/DRESSINGS) IMPLANT
BINDER BREAST XLRG (GAUZE/BANDAGES/DRESSINGS) ×2 IMPLANT
BLADE SURG 15 STRL LF DISP TIS (BLADE) ×1 IMPLANT
BLADE SURG 15 STRL SS (BLADE) ×1
CANISTER SUCTION 2500CC (MISCELLANEOUS) ×2 IMPLANT
CHLORAPREP W/TINT 26ML (MISCELLANEOUS) ×2 IMPLANT
CLIP APPLIE 9.375 MED OPEN (MISCELLANEOUS) IMPLANT
CLIP APPLIE 9.375 SM OPEN (CLIP) ×1 IMPLANT
CLOTH BEACON ORANGE TIMEOUT ST (SAFETY) ×2 IMPLANT
COVER SURGICAL LIGHT HANDLE (MISCELLANEOUS) ×2 IMPLANT
DERMABOND ADVANCED (GAUZE/BANDAGES/DRESSINGS) ×1
DERMABOND ADVANCED .7 DNX12 (GAUZE/BANDAGES/DRESSINGS) ×1 IMPLANT
DEVICE DUBIN SPECIMEN MAMMOGRA (MISCELLANEOUS) IMPLANT
DRAPE CHEST BREAST 15X10 FENES (DRAPES) ×2 IMPLANT
ELECT CAUTERY BLADE 6.4 (BLADE) ×2 IMPLANT
ELECT REM PT RETURN 9FT ADLT (ELECTROSURGICAL) ×2
ELECTRODE REM PT RTRN 9FT ADLT (ELECTROSURGICAL) ×1 IMPLANT
GLOVE BIOGEL PI IND STRL 7.0 (GLOVE) ×2 IMPLANT
GLOVE BIOGEL PI INDICATOR 7.0 (GLOVE) ×2
GLOVE EUDERMIC 7 POWDERFREE (GLOVE) ×2 IMPLANT
GLOVE SURG SS PI 7.0 STRL IVOR (GLOVE) ×2 IMPLANT
GOWN STRL NON-REIN LRG LVL3 (GOWN DISPOSABLE) ×2 IMPLANT
GOWN STRL REIN XL XLG (GOWN DISPOSABLE) ×2 IMPLANT
KIT BASIN OR (CUSTOM PROCEDURE TRAY) ×2 IMPLANT
KIT MARKER MARGIN INK (KITS) IMPLANT
KIT ROOM TURNOVER OR (KITS) ×2 IMPLANT
NEEDLE HYPO 25GX1X1/2 BEV (NEEDLE) ×2 IMPLANT
NS IRRIG 1000ML POUR BTL (IV SOLUTION) ×2 IMPLANT
PACK SURGICAL SETUP 50X90 (CUSTOM PROCEDURE TRAY) ×2 IMPLANT
PAD ARMBOARD 7.5X6 YLW CONV (MISCELLANEOUS) ×2 IMPLANT
PENCIL BUTTON HOLSTER BLD 10FT (ELECTRODE) ×2 IMPLANT
SPONGE LAP 4X18 X RAY DECT (DISPOSABLE) ×2 IMPLANT
STAPLER VISISTAT 35W (STAPLE) ×2 IMPLANT
SUT MON AB 4-0 PC3 18 (SUTURE) ×2 IMPLANT
SUT SILK 2 0 SH (SUTURE) IMPLANT
SUT VIC AB 3-0 SH 18 (SUTURE) ×4 IMPLANT
SYR BULB 3OZ (MISCELLANEOUS) ×2 IMPLANT
SYR CONTROL 10ML LL (SYRINGE) ×2 IMPLANT
TOWEL OR 17X24 6PK STRL BLUE (TOWEL DISPOSABLE) ×2 IMPLANT
TOWEL OR 17X26 10 PK STRL BLUE (TOWEL DISPOSABLE) ×2 IMPLANT
TUBE CONNECTING 12X1/4 (SUCTIONS) ×2 IMPLANT
YANKAUER SUCT BULB TIP NO VENT (SUCTIONS) ×2 IMPLANT

## 2012-09-11 NOTE — Preoperative (Signed)
Beta Blockers   Reason not to administer Beta Blockers:Not Applicable 

## 2012-09-11 NOTE — Anesthesia Preprocedure Evaluation (Signed)
Anesthesia Evaluation  Patient identified by MRN, date of birth, ID band Patient awake    Reviewed: Allergy & Precautions, H&P , NPO status , Patient's Chart, lab work & pertinent test results, reviewed documented beta blocker date and time   Airway Mallampati: II TM Distance: >3 FB Neck ROM: full    Dental   Pulmonary neg pulmonary ROS,  breath sounds clear to auscultation        Cardiovascular hypertension, On Medications Rhythm:regular     Neuro/Psych negative neurological ROS  negative psych ROS   GI/Hepatic negative GI ROS, Neg liver ROS,   Endo/Other  negative endocrine ROS  Renal/GU negative Renal ROS  negative genitourinary   Musculoskeletal   Abdominal   Peds  Hematology negative hematology ROS (+)   Anesthesia Other Findings See surgeon's H&P   Reproductive/Obstetrics negative OB ROS                           Anesthesia Physical Anesthesia Plan  ASA: II  Anesthesia Plan: General   Post-op Pain Management:    Induction: Intravenous  Airway Management Planned: LMA  Additional Equipment:   Intra-op Plan:   Post-operative Plan:   Informed Consent: I have reviewed the patients History and Physical, chart, labs and discussed the procedure including the risks, benefits and alternatives for the proposed anesthesia with the patient or authorized representative who has indicated his/her understanding and acceptance.   Dental Advisory Given  Plan Discussed with: CRNA and Surgeon  Anesthesia Plan Comments:         Anesthesia Quick Evaluation  

## 2012-09-11 NOTE — Op Note (Signed)
KYANI SIMKIN Vanderweele [birth year] [id number] 09/05/2012  Preoperative diagnosis: left breast cancer, clinical stage 0, lower outer quadrant, approximately 11 cm  Postoperative diagnosis: same  Procedure: wire localized excision left breast cancer  Surgeon: Currie Paris, MD, FACS   Anesthesia: General   Clinical History and Indications: this patient had an area of calcifications that looked worrisome for DCIS and a needle core biopsy confirmed that. The area was 11 cm long and a second core biopsy at the lateral and confirmed that the entire area likely had DCIS as both biopsies were positive. She therefore elected a large lumpectomy although we did discuss the alternative of mastectomy with reconstruction.    Description of Procedure: I saw the patient a preoperative area and confirmed the plans, I marked the left breast to the operative side. I reviewed the wire localizing films which contained 2 wires to delineate the medial anterior and lateral posterior margins of the abnormality. Patient was then taken to the operating room and after satisfactory general anesthesia was obtained the left breast was prepped and draped in a time out performed.  The guidewires entered in the lower outer quadrant and tracks somewhat superior and medial. I made a radial oriented incision starting near the areolar margin and going laterally and inferiorly until I was beyond the more lateral placed wire.I elevated very thin skin flaps barely in the subdermal is area and then divided the breast tissue medially all the way down towards the chest wall. I then worked from the superior medial aspect of the incision to the inferior lateral taking a wide long deep area of breast tissue. I thought was well around all of the areas and both wires were in the specimen, although as I was manipulating the specimen the medial wire came out. Specimen mammogram showed both clips and the specimen. Try to assure negative margins, since  this was a large area of involvement, I took additional tissue from the inferior margin including a portion of the medial margin, the medial margin to include a portion of the superior margin, and a portion of the lateral margin to include a portion of the superior margin. I also took a small amount of the tissue that was left on the chest wall so it included the fascia for the deep margin.  I irrigated copiously, and made sure everything was dry. I infiltrated 30 cc of 0.25% plain Marcaine and to help with postop pain relief. I used small clips to mark all of the margins. I closed in layers with 3-0 Vicryl, 4-0 Monocryl subcuticular, and Dermabond.  The patient tolerated the procedure well. There were no complications. Counts were correct.estimated blood loss was minimal.  Currie Paris, MD, FACS 09/11/2012 11:21 AM

## 2012-09-11 NOTE — Transfer of Care (Signed)
Immediate Anesthesia Transfer of Care Note  Patient: Karen Lawrence  Procedure(s) Performed: Procedure(s): BREAST LUMPECTOMY WITH NEEDLE LOCALIZATION removal of left breast cancer  (Left)  Patient Location: PACU  Anesthesia Type:General  Level of Consciousness: awake, alert , oriented and patient cooperative  Airway & Oxygen Therapy: Patient Spontanous Breathing and Patient connected to nasal cannula oxygen  Post-op Assessment: Report given to PACU RN, Post -op Vital signs reviewed and stable and Patient moving all extremities  Post vital signs: Reviewed and stable  Complications: No apparent anesthesia complications

## 2012-09-11 NOTE — Interval H&P Note (Signed)
History and Physical Interval Note:  09/11/2012 9:17 AM  Karen Lawrence  has presented today for surgery, with the diagnosis of left breast cancer   The various methods of treatment have been discussed with the patient and family. After consideration of risks, benefits and other options for treatment, the patient has consented to  Procedure(s): BREAST LUMPECTOMY WITH NEEDLE LOCALIZATION removal of left breast cancer  (Left) as a surgical intervention .  The patient's history has been reviewed, patient examined, no change in status, stable for surgery.  I have reviewed the patient's chart and labs.  Questions were answered to the patient's satisfaction.     have reviewed the localizing films and marked the left breast as the operative side   Katrece Roediger J

## 2012-09-11 NOTE — H&P (View-Only) (Signed)
NAME: Karen Lawrence       DOB: 02/28/1955           DATE: 09/05/2012       MRN:[id number]  CC:  Chief Complaint  Patient presents with  . Breast Cancer    s/p 2nd biopsy    HPI: she comes back for further evaluation discussion following a second needle core biopsy of the left breast. Her mammogram showed a large area of calcifications extending from near the areola out laterally and posteriorly and inferiorly. A second biopsy was done at the lateral aspect is the first was done at the medial aspect and a second biopsy confirmed DCIS. She'll need an extensive lumpectomy to excise entire knee.  EXAM: Vital signs: BP 130/82  Pulse 88  Temp(Src) 98.5 F (36.9 C) (Temporal)  Resp 14  Ht 5' 4.5" (1.638 m)  Wt 139 lb 6.4 oz (63.231 kg)  BMI 23.57 kg/m2  General: Patient alert, oriented, NAD  Breasts: She has no evidence of hematoma from her more recent biopsy. IMP: Left breast cancer large area of DCIS  PLAN: We will proceed with NL lumpectomy which we discussed at length today. She will need to have the area bracketed. We discussed the potential of + margin and need for second excision and or mastectomy. We also discussed again the potential deformity of the breast. All questions answered. Outlined for her the extent of the incision and described the surgery in a fair amount of detail.  Lavere Stork J 09/05/2012   

## 2012-09-11 NOTE — Anesthesia Procedure Notes (Signed)
Procedure Name: LMA Insertion Date/Time: 09/11/2012 10:00 AM Performed by: Jerilee Hoh Pre-anesthesia Checklist: Patient identified, Emergency Drugs available, Suction available and Patient being monitored Patient Re-evaluated:Patient Re-evaluated prior to inductionOxygen Delivery Method: Circle system utilized Preoxygenation: Pre-oxygenation with 100% oxygen Intubation Type: IV induction LMA: LMA inserted LMA Size: 4.0 Number of attempts: 1 Placement Confirmation: positive ETCO2 and breath sounds checked- equal and bilateral Tube secured with: Tape Dental Injury: Teeth and Oropharynx as per pre-operative assessment

## 2012-09-11 NOTE — Anesthesia Postprocedure Evaluation (Signed)
Anesthesia Post Note  Patient: Karen Lawrence  Procedure(s) Performed: Procedure(s) (LRB): BREAST LUMPECTOMY WITH NEEDLE LOCALIZATION removal of left breast cancer  (Left)  Anesthesia type: General  Patient location: PACU  Post pain: Pain level controlled  Post assessment: Patient's Cardiovascular Status Stable  Last Vitals:  Filed Vitals:   09/11/12 1145  BP: 125/64  Pulse: 60  Temp:   Resp: 13    Post vital signs: Reviewed and stable  Level of consciousness: alert  Complications: No apparent anesthesia complications

## 2012-09-12 ENCOUNTER — Encounter (INDEPENDENT_AMBULATORY_CARE_PROVIDER_SITE_OTHER): Payer: Self-pay

## 2012-09-12 ENCOUNTER — Telehealth (INDEPENDENT_AMBULATORY_CARE_PROVIDER_SITE_OTHER): Payer: Self-pay | Admitting: General Surgery

## 2012-09-12 LAB — URINE CULTURE: Culture: NO GROWTH

## 2012-09-12 NOTE — Telephone Encounter (Signed)
Spoke with patient she is aware of  Po f/u appt on 10/05/12 at 12:10

## 2012-09-13 ENCOUNTER — Encounter (HOSPITAL_COMMUNITY): Payer: Self-pay | Admitting: Surgery

## 2012-09-14 ENCOUNTER — Telehealth (INDEPENDENT_AMBULATORY_CARE_PROVIDER_SITE_OTHER): Payer: Self-pay | Admitting: Surgery

## 2012-09-14 NOTE — Telephone Encounter (Signed)
I called her about her path today. She says she is doing well. Discussed with her that all is DCIS, no invasive present. Margins are negative, but anterior is close.   I told her we will request a radiation oncology consult and a medical oncologist as well, but she won't need chemo since this is all non-invasive.

## 2012-09-17 ENCOUNTER — Other Ambulatory Visit (INDEPENDENT_AMBULATORY_CARE_PROVIDER_SITE_OTHER): Payer: Self-pay

## 2012-09-17 DIAGNOSIS — C50912 Malignant neoplasm of unspecified site of left female breast: Secondary | ICD-10-CM

## 2012-09-17 NOTE — Addendum Note (Signed)
Addended byLiliana Cline on: 09/17/2012 10:37 AM   Modules accepted: Orders

## 2012-09-19 ENCOUNTER — Telehealth (INDEPENDENT_AMBULATORY_CARE_PROVIDER_SITE_OTHER): Payer: Self-pay

## 2012-09-19 NOTE — Telephone Encounter (Signed)
Pt calling b/c no one has contacted her about the medical and radiation oncology appt's . I advised pt that the referrals have been sent to [facility] and radiation. As I was talking to the pt in the nursing office Penni Bombard overheard the pt's name and said she already sent Dawn/Lisa staff messages about the pt not having appt's yet. I advised pt that if she still does not hear from the [facility] to call us back to let us know. The pt understands.

## 2012-09-21 ENCOUNTER — Telehealth (INDEPENDENT_AMBULATORY_CARE_PROVIDER_SITE_OTHER): Payer: Self-pay | Admitting: General Surgery

## 2012-09-21 ENCOUNTER — Telehealth: Payer: Self-pay | Admitting: *Deleted

## 2012-09-21 ENCOUNTER — Telehealth: Payer: Self-pay | Admitting: Genetic Counselor

## 2012-09-21 DIAGNOSIS — C50919 Malignant neoplasm of unspecified site of unspecified female breast: Secondary | ICD-10-CM

## 2012-09-21 NOTE — Telephone Encounter (Signed)
Returned patient's call, but there was no answer.  Left VM message that I had tried to return call, and to please call back.

## 2012-09-21 NOTE — Telephone Encounter (Signed)
Gave pt appt for consult with Dr. Welton Flakes on 10/01/12 at 1:15 and 1245 for labs.  Received verbal confirmation.  Pt denies further needs at this time.  Contact information given.

## 2012-09-21 NOTE — Telephone Encounter (Signed)
Message copied by Liliana Cline on Fri Sep 21, 2012  8:22 AM ------      Message from: Marianne Sofia C      Created: Thu Sep 20, 2012  5:32 PM      Regarding: RE: med onc appt       Do you mind putting a referral in for med onc?      We have one for rad onc and genetic.            Thanks,      Dawn            ----- Message -----         From: Littie Deeds         Sent: 09/18/2012   3:13 PM           To: Lysbeth Penner, RN      Subject: RE: med onc appt                                         Yes she needs to see both med onc and rad onc.  According to Dr. Jamey Ripa and his nurse.            Thanks for your help.            Penni Bombard      ----- Message -----         From: Lysbeth Penner, RN         Sent: 09/17/2012   4:48 PM           To: Littie Deeds      Subject: med onc appt                                             Leonarda Salon,            Does Ms. Craton need to see both med and rad onc?            Thanks,      Duwaine Maxin asked if we received a referral and I didn't see one.      Just let me know.                   ------

## 2012-09-21 NOTE — Telephone Encounter (Signed)
Patient called to see where her letter and results were.  I confirmed with her that they were a stack to be signed and we will try to get that signed and sent out next week.

## 2012-10-01 ENCOUNTER — Other Ambulatory Visit: Payer: Self-pay | Admitting: *Deleted

## 2012-10-01 ENCOUNTER — Encounter: Payer: Self-pay | Admitting: Oncology

## 2012-10-01 ENCOUNTER — Ambulatory Visit (HOSPITAL_BASED_OUTPATIENT_CLINIC_OR_DEPARTMENT_OTHER): Payer: 59

## 2012-10-01 ENCOUNTER — Ambulatory Visit (HOSPITAL_BASED_OUTPATIENT_CLINIC_OR_DEPARTMENT_OTHER): Payer: 59 | Admitting: Oncology

## 2012-10-01 ENCOUNTER — Other Ambulatory Visit (HOSPITAL_BASED_OUTPATIENT_CLINIC_OR_DEPARTMENT_OTHER): Payer: 59

## 2012-10-01 ENCOUNTER — Telehealth: Payer: Self-pay | Admitting: *Deleted

## 2012-10-01 VITALS — BP 166/84 | HR 67 | Temp 98.4°F | Resp 20 | Ht 64.5 in | Wt 140.2 lb

## 2012-10-01 DIAGNOSIS — I10 Essential (primary) hypertension: Secondary | ICD-10-CM

## 2012-10-01 DIAGNOSIS — D0512 Intraductal carcinoma in situ of left breast: Secondary | ICD-10-CM

## 2012-10-01 DIAGNOSIS — D0590 Unspecified type of carcinoma in situ of unspecified breast: Secondary | ICD-10-CM

## 2012-10-01 DIAGNOSIS — Z170 Estrogen receptor positive status [ER+]: Secondary | ICD-10-CM

## 2012-10-01 DIAGNOSIS — C50912 Malignant neoplasm of unspecified site of left female breast: Secondary | ICD-10-CM

## 2012-10-01 LAB — CBC WITH DIFFERENTIAL/PLATELET
BASO%: 0.7 % (ref 0.0–2.0)
Eosinophils Absolute: 0.1 10*3/uL (ref 0.0–0.5)
HCT: 36.1 % (ref 34.8–46.6)
LYMPH%: 32.2 % (ref 14.0–49.7)
MCHC: 32.7 g/dL (ref 31.5–36.0)
MCV: 91 fL (ref 79.5–101.0)
MONO#: 0.6 10*3/uL (ref 0.1–0.9)
MONO%: 12.4 % (ref 0.0–14.0)
NEUT%: 52.5 % (ref 38.4–76.8)
Platelets: 205 10*3/uL (ref 145–400)
WBC: 4.7 10*3/uL (ref 3.9–10.3)

## 2012-10-01 LAB — COMPREHENSIVE METABOLIC PANEL (CC13)
Alkaline Phosphatase: 59 U/L (ref 40–150)
CO2: 25 mEq/L (ref 22–29)
Creatinine: 0.9 mg/dL (ref 0.6–1.1)
Glucose: 82 mg/dl (ref 70–140)
Total Bilirubin: 0.5 mg/dL (ref 0.20–1.20)

## 2012-10-01 NOTE — Telephone Encounter (Signed)
Pt is aware that i emailed kk to advise me on a time slot. Per pof kk wants to see the pt in 1 month, however her first opening is 10.3.14. Pt is aware that i will call her w/ an appt d/t as soon as kk respond....td

## 2012-10-01 NOTE — Patient Instructions (Addendum)
We discussed your diagnosis and treatment  Keep your appointment with Dr. Basilio Cairo  Tamoxifen oral tablet What is this medicine? TAMOXIFEN (ta MOX i fen) blocks the effects of estrogen. It is commonly used to treat breast cancer. It is also used to decrease the chance of breast cancer coming back in women who have received treatment for the disease. It may also help prevent breast cancer in women who have a high risk of developing breast cancer. This medicine may be used for other purposes; ask your health care provider or pharmacist if you have questions. What should I tell my health care provider before I take this medicine? They need to know if you have any of these conditions: -blood clots -blood disease -cataracts or impaired eyesight -endometriosis -high calcium levels -high cholesterol -irregular menstrual cycles -liver disease -stroke -uterine fibroids -an unusual or allergic reaction to tamoxifen, other medicines, foods, dyes, or preservatives -pregnant or trying to get pregnant -breast-feeding How should I use this medicine? Take this medicine by mouth with a glass of water. Follow the directions on the prescription label. You can take it with or without food. Take your medicine at regular intervals. Do not take your medicine more often than directed. Do not stop taking except on your doctor's advice. A special MedGuide will be given to you by the pharmacist with each prescription and refill. Be sure to read this information carefully each time. Talk to your pediatrician regarding the use of this medicine in children. While this drug may be prescribed for selected conditions, precautions do apply. Overdosage: If you think you have taken too much of this medicine contact a poison control center or emergency room at once. NOTE: This medicine is only for you. Do not share this medicine with others. What if I miss a dose? If you miss a dose, take it as soon as you can. If it is  almost time for your next dose, take only that dose. Do not take double or extra doses. What may interact with this medicine? -aminoglutethimide -bromocriptine -chemotherapy drugs -female hormones, like estrogens and birth control pills -letrozole -medroxyprogesterone -phenobarbital -rifampin -warfarin This list may not describe all possible interactions. Give your health care provider a list of all the medicines, herbs, non-prescription drugs, or dietary supplements you use. Also tell them if you smoke, drink alcohol, or use illegal drugs. Some items may interact with your medicine. What should I watch for while using this medicine? Visit your doctor or health care professional for regular checks on your progress. You will need regular pelvic exams, breast exams, and mammograms. If you are taking this medicine to reduce your risk of getting breast cancer, you should know that this medicine does not prevent all types of breast cancer. If breast cancer or other problems occur, there is no guarantee that it will be found at an early stage. Do not become pregnant while taking this medicine or for 2 months after stopping this medicine. Stop taking this medicine if you get pregnant or think you are pregnant and contact your doctor. This medicine may harm your unborn baby. Women who can possibly become pregnant should use birth control methods that do not use hormones during tamoxifen treatment and for 2 months after therapy has stopped. Talk with your health care provider for birth control advice. Do not breast feed while taking this medicine. What side effects may I notice from receiving this medicine? Side effects that you should report to your doctor or health care professional  as soon as possible: -changes in vision (blurred vision) -changes in your menstrual cycle -difficulty breathing or shortness of breath -difficulty walking or talking -new breast lumps -numbness -pelvic pain or  pressure -redness, blistering, peeling or loosening of the skin, including inside the mouth -skin rash or itching (hives) -sudden chest pain -swelling of lips, face, or tongue -swelling, pain or tenderness in your calf or leg -unusual bruising or bleeding -vaginal discharge that is bloody, brown, or rust -weakness -yellowing of the whites of the eyes or skin Side effects that usually do not require medical attention (report to your doctor or health care professional if they continue or are bothersome): -fatigue -hair loss, although uncommon and is usually mild -headache -hot flashes -impotence (in men) -nausea, vomiting (mild) -vaginal discharge (white or clear) This list may not describe all possible side effects. Call your doctor for medical advice about side effects. You may report side effects to FDA at 1-800-FDA-1088. Where should I keep my medicine? Keep out of the reach of children. Store at room temperature between 20 and 25 degrees C (68 and 77 degrees F). Protect from light. Keep container tightly closed. Throw away any unused medicine after the expiration date. NOTE: This sheet is a summary. It may not cover all possible information. If you have questions about this medicine, talk to your doctor, pharmacist, or health care provider.  2012, [employer]. (11/01/2007 12:01:56 PM)  Anastrozole tablets What is this medicine? ANASTROZOLE (an AS troe zole) is used to treat breast cancer in women who have gone through menopause. Some types of breast cancer depend on estrogen to grow, and this medicine can stop tumor growth by blocking estrogen production. This medicine may be used for other purposes; ask your health care provider or pharmacist if you have questions. What should I tell my health care provider before I take this medicine? They need to know if you have any of these conditions: -liver disease -an unusual or allergic reaction to anastrozole, other medicines,  foods, dyes, or preservatives -pregnant or trying to get pregnant -breast-feeding How should I use this medicine? Take this medicine by mouth with a glass of water. Follow the directions on the prescription label. You can take this medicine with or without food. Take your doses at regular intervals. Do not take your medicine more often than directed. Do not stop taking except on the advice of your doctor or health care professional. Talk to your pediatrician regarding the use of this medicine in children. Special care may be needed. Overdosage: If you think you have taken too much of this medicine contact a poison control center or emergency room at once. NOTE: This medicine is only for you. Do not share this medicine with others. What if I miss a dose? If you miss a dose, take it as soon as you can. If it is almost time for your next dose, take only that dose. Do not take double or extra doses. What may interact with this medicine? Do not take this medicine with any of the following medications: -female hormones, like estrogens or progestins and birth control pills This medicine may also interact with the following medications: -tamoxifen This list may not describe all possible interactions. Give your health care provider a list of all the medicines, herbs, non-prescription drugs, or dietary supplements you use. Also tell them if you smoke, drink alcohol, or use illegal drugs. Some items may interact with your medicine. What should I watch for while using this medicine?  Visit your doctor or health care professional for regular checks on your progress. Let your doctor or health care professional know about any unusual vaginal bleeding. Do not treat yourself for diarrhea, nausea, vomiting or other side effects. Ask your doctor or health care professional for advice. What side effects may I notice from receiving this medicine? Side effects that you should report to your doctor or health care  professional as soon as possible: -allergic reactions like skin rash, itching or hives, swelling of the face, lips, or tongue -any new or unusual symptoms -breathing problems -chest pain -leg pain or swelling -vomiting Side effects that usually do not require medical attention (report to your doctor or health care professional if they continue or are bothersome): -back or bone pain -cough, or throat infection -diarrhea or constipation -dizziness -headache -hot flashes -loss of appetite -nausea -sweating -weakness and tiredness -weight gain This list may not describe all possible side effects. Call your doctor for medical advice about side effects. You may report side effects to FDA at 1-800-FDA-1088. Where should I keep my medicine? Keep out of the reach of children. Store at room temperature between 20 and 25 degrees C (68 and 77 degrees F). Throw away any unused medicine after the expiration date. NOTE: This sheet is a summary. It may not cover all possible information. If you have questions about this medicine, talk to your doctor, pharmacist, or health care provider.  2013, [employer] Standard. (04/27/2007 4:31:52 PM)

## 2012-10-01 NOTE — Progress Notes (Signed)
Checked in new patient. I gave her the [facility] packet. She wants mail and phone only. Didn't ask if living will/POA.

## 2012-10-01 NOTE — Progress Notes (Signed)
Karen Lawrence [id number] [birth year] 57 y.o. 10/01/2012 2:14 PM  CC  Emeterio Reeve, MD [address] [place] [address] Dr. Cyndia Bent Dr. Lonie Peak  REASON FOR CONSULTATION:  57 year old female with new diagnosis of DCIS of the left breast  STAGE:   No matching staging information was found for the patient.  REFERRING PHYSICIAN: Dr. Cyndia Bent  HISTORY OF PRESENT ILLNESS:  Karen Lawrence is a 57 y.o. female.  Who presented 4 AM you'll mammogram in June 2014. This revealed a 11 cm area of suspicious calcifications in the left breast. Biopsy performed on June 10 Beal DCIS that was ER +100% PR +70%. MRI of the breasts in the left breast revealed in the anterior third of the lateral breast postbiopsy changes and signal void artifact. Patchy non-masslike enhancement throughout the lower outer quadrant of the left breast measuring 13.1 cm in anterior-posterior transverse and longitudinal dimensions. No adenopathy. Patient had additional biopsies on 08/30/2012 which again revealed DCIS that was ER positive.  Patient underwent a large lumpectomy on 09/11/2012. Final pathology did reveala 0.5 cm DCIS intermediate grade. Tumor was ER +100% PR +51%. Final staging Tis NX. Postoperatively she is doing well without any   Past Medical History: Past Medical History  Diagnosis Date  . Breast cancer 07/2012    DCIS  . Hypertension     Past Surgical History: Past Surgical History  Procedure Laterality Date  . Tubal ligation    . Wisdom tooth extraction  2004  . Breast lumpectomy with needle localization Left 09/11/2012    Procedure: BREAST LUMPECTOMY WITH NEEDLE LOCALIZATION removal of left breast cancer ;  Surgeon: Currie Paris, MD;  Location: [facility] OR;  Service: General;  Laterality: Left;    Family History: Family History  Problem Relation Age of Onset  . Breast cancer Mother 104  . Cancer Father   . Diabetes Father   . Breast cancer Sister 80  .  Breast cancer Sister 75  . Breast cancer Sister 4  . Diabetes Sister   . Prostate cancer Brother 1  . HIV/AIDS Brother   . Uterine cancer Paternal Aunt     dx in her mid 32s    Social History History  Substance Use Topics  . Smoking status: Never Smoker   . Smokeless tobacco: Never Used  . Alcohol Use: No    Allergies: No Known Allergies  Current Medications: Current Outpatient Prescriptions  Medication Sig Dispense Refill  . cholecalciferol (VITAMIN D) 1000 UNITS tablet Take 1,000 Units by mouth daily.      . IODINE, KELP, PO Take by mouth daily.      . Multiple Vitamin (MULTIVITAMIN WITH MINERALS) TABS Take 1 tablet by mouth daily.      Marland Kitchen triamterene-hydrochlorothiazide (MAXZIDE-25) 37.5-25 MG per tablet Take 1 tablet by mouth daily. Pt taking 1/2 tablet      . vitamin C (ASCORBIC ACID) 500 MG tablet Take 500 mg by mouth daily.      Marland Kitchen oxyCODONE-acetaminophen (ROXICET) 5-325 MG per tablet Take 1 tablet by mouth every 4 (four) hours as needed for pain.  30 tablet  0   No current facility-administered medications for this visit.    OB/GYN History:  Fertility Discussion: n/a Prior History of Cancer: no  Health Maintenance:  Colonoscopy  Bone Density Last PAP smear  ECOG PERFORMANCE STATUS: 0 - Asymptomatic  Genetic Counseling/testing: no  REVIEW OF SYSTEMS:  A comprehensive review of systems was negative.  PHYSICAL EXAMINATION: Blood  pressure 166/84, pulse 67, temperature 98.4 F (36.9 C), temperature source Oral, resp. rate 20, height 5' 4.5" (1.638 m), weight 140 lb 3.2 oz (63.594 kg).  ZOX:WRUEA, healthy, no distress, well nourished and well developed SKIN: skin color, texture, turgor are normal HEAD: Normocephalic EYES: PERRLA, EOMI EARS: External ears normal OROPHARYNX:no exudate and no erythema  NECK: supple, no adenopathy LYMPH:  no palpable lymphadenopathy BREAST:surgical scars noted in the lef breast LUNGS: clear to auscultation  HEART:  regular rate & rhythm ABDOMEN:abdomen soft, non-tender, normal bowel sounds and no masses or organomegaly BACK: Back symmetric, no curvature. EXTREMITIES:no edema, no clubbing, no cyanosis  NEURO: alert & oriented x 3 with fluent speech, no focal motor/sensory deficits     STUDIES/RESULTS: Chest 2 View  09/10/2012   *RADIOLOGY REPORT*  Clinical Data: Left-sided breast cancer.  CHEST - 2 VIEW  Comparison: None.  Findings: Heart size is normal.  The lungs are clear.  The visualized soft tissues and bony thorax are unremarkable.  IMPRESSION: Negative two-view chest radiograph.   Original Report Authenticated By: Marin Roberts, M.D.   Mm Lt Plc Breast Loc Dev   1st Lesion  Inc Mammo Guide  09/11/2012   *RADIOLOGY REPORT*  Clinical Data: Patient with left breast DCIS  NEEDLE LOCALIZATION WITH MAMMOGRAPHIC GUIDANCE AND SPECIMEN RADIOGRAPH  Comparison:  Priors including 08/30/2012  Patient presents for needle localization prior to lumpectomy. I met with the patient and we discussed the procedure of needle localization including benefits and alternatives. We discussed the high likelihood of a successful procedure. We discussed the risks of the procedure, including infection, bleeding, tissue injury, and further surgery. Informed, written consent was given. The usual time-out protocol was performed immediately prior to the procedure.  Using mammographic guidance, sterile technique, 2% lidocaine and a 7 cm modified Kopans needle the posterior extent of the calcifications and posterior biopsy marking clip within the lateral left breast were localized using a lateral approach.  Additionally the anterior extent of the calcifications and anterior biopsy marking clip were localized using lateral approach and a 5 cm Kopans wire.  The films are marked for Dr. Jamey Ripa. Specimen radiograph was performed at Day Surgery and confirms the anterior and posterior biopsy marking clips as well as the posterior Kopans wire  to be present in the tissue sample.  The specimen is marked for pathology.  IMPRESSION: Needle localization of calcifications and biopsy marking clips in the lateral left breast.  No apparent complications.   Original Report Authenticated By: Annia Belt, M.D     LABS:    Chemistry      Component Value Date/Time   NA 141 10/01/2012 1245   NA 136 09/10/2012 1236   K 4.1 10/01/2012 1245   K 3.6 09/10/2012 1236   CL 101 09/10/2012 1236   CO2 25 10/01/2012 1245   CO2 25 09/10/2012 1236   BUN 13.1 10/01/2012 1245   BUN 18 09/10/2012 1236   CREATININE 0.9 10/01/2012 1245   CREATININE 0.94 09/10/2012 1236      Component Value Date/Time   CALCIUM 9.6 10/01/2012 1245   CALCIUM 9.7 09/10/2012 1236   ALKPHOS 59 10/01/2012 1245   ALKPHOS 59 09/10/2012 1236   AST 34 10/01/2012 1245   AST 30 09/10/2012 1236   ALT 32 10/01/2012 1245   ALT 26 09/10/2012 1236   BILITOT 0.50 10/01/2012 1245   BILITOT 0.4 09/10/2012 1236      Lab Results  Component Value Date   WBC 4.7 10/01/2012  HGB 11.8 10/01/2012   HCT 36.1 10/01/2012   MCV 91.0 10/01/2012   PLT 205 10/01/2012       PATHOLOGY:  ASSESSMENT    57 year old female with  #1 DCIS measuring at least 8.5 cm ER positive PR positive. Patient is status post generous lumpectomy. Postoperatively doing well.  #2 she is a good candidate for adjuvant radiation therapy she will be seeing Dr. Lonie Peak.  #3 patient and I discussed adjuvant antiestrogen therapy with tamoxifen or Aromasin. We discussed rationale risks benefits of treatment.  Clinical Trial Eligibility: no Multidisciplinary conference discussion no     PLAN:    #1 proceed with radiation therapy.  #2 she will return after completion of radiation therapy to discuss further adjuvant antiestrogen therapy to help prevent future breast cancer        Discussion: Patient is being treated per NCCN breast cancer care guidelines appropriate for stage.0   Thank you so much for allowing me to participate in  the care of Karen Lawrence. I will continue to follow up the patient with you and assist in her care.  All questions were answered. The patient knows to call the clinic with any problems, questions or concerns. We can certainly see the patient much sooner if necessary.  I spent 55 minutes counseling the patient face to face. The total time spent in the appointment was 60 minutes.  Drue Second, MD Medical/Oncology [facility] [phone] (beeper) [phone] (Office)  10/01/2012, 2:14 PM

## 2012-10-02 ENCOUNTER — Encounter: Payer: Self-pay | Admitting: Radiation Oncology

## 2012-10-02 NOTE — Progress Notes (Addendum)
Location of Breast Cancer: Lower Outer Quadrant of Left Breast - mammogram showed a large area of calcifications extending from near the areola out laterally and posteriorly and inferiorl  Histology per Pathology Report:  Diagnosis 1. Breast, lumpectomy, Left - DUCTAL CARCINOMA IN SITU WITH CALCIFICATIONS, INTERMEDIATE GRADE, SPANNING AT LEAST 8.5 CM. - DUCTAL CARCINOMA IN SITU IS BROADLY LESS THAN 0.1 CM TO THE ANTERIOR MARGIN OF SPECIMEN #1. - DUCTAL CARCINOMA IN SITU IS BROADLY 0.1 CM TO THE INFERIOR MARGIN OF SPECIMEN #1. - LOBULAR NEOPLASIA (ATYPICAL LOBULAR HYPERPLASIA). - SEE ONCOLOGY TABLE BELOW. 2. Breast, excision, Left Posterior - DUCTAL CARCINOMA IN SITU, INTERMEDIATE GRADE. - DUCTAL CARCINOMA IN SITU IS GREATER THAN 0.2 CM TO THE NEW SURGICAL RESECTION MARGIN. 3. Breast, excision, Left medial and medial superior - BENIGN BREAST PARENCHYMA. - THERE IS NO EVIDENCE OF MALIGNANCY. - SEE COMMENT. 4. Breast, excision, Left inferior margin - DUCTAL CARCINOMA IN SITU, INTERMEDIATE GRADE. - DUCTAL CARCINOMA IN SITU IS GREATER THAN 0.2 CM TO THE NEW SURGICAL RESECTION MARGIN. 5. Breast, excision, Left lateral and lateral superior margin - LOBULAR NEOPLASM (ATYPICAL LOBULAR HYPERPLASIA).     Receptor Status: Estrogen receptor: 100% and 100%, strong staining intensity. Progesterone receptor: 51% and 7%, strong and moderate staining intensity, respectively  Did patient present with symptoms (if so, please note symptoms) or was this found on screening mammography?:found screening mammogram Past/Anticipated interventions by surgeon, if any: Lumpectomy Left Breast  Past/Anticipated interventions by medical oncology, if ZOX:WRUE  Lymphedema issues, if any:   No  Pain issues, if any:  Tender left breast  SAFETY ISSUES:  Prior radiation? No  Pacemaker/ICD?NO  Possible current pregnancy?No Is the patient on methotrexate?No  Current Complaints / other details:   Married, 2  daughters, Self-employed,,father cancer,DM Mother (44)and 3 sisters breast cancer ages, (74,50,62,) paternal aunt -Uterine cancer,(Dx mid 60's)brother, HIV/Aids,, brother prostate cancer (62) Kyah Buesing, Donnelly Angelica, RN 10/02/2012,6:11 PM

## 2012-10-03 ENCOUNTER — Telehealth: Payer: Self-pay | Admitting: *Deleted

## 2012-10-03 ENCOUNTER — Ambulatory Visit
Admission: RE | Admit: 2012-10-03 | Discharge: 2012-10-03 | Disposition: A | Discharge status: Home / Self Care | Payer: 59 | Source: Ambulatory Visit | Attending: Radiation Oncology | Admitting: Radiation Oncology

## 2012-10-03 ENCOUNTER — Encounter: Payer: Self-pay | Admitting: Radiation Oncology

## 2012-10-03 VITALS — BP 146/93 | HR 70 | Temp 97.6°F | Resp 20 | Ht 64.5 in | Wt 137.9 lb

## 2012-10-03 DIAGNOSIS — C50419 Malignant neoplasm of upper-outer quadrant of unspecified female breast: Secondary | ICD-10-CM | POA: Insufficient documentation

## 2012-10-03 DIAGNOSIS — C50912 Malignant neoplasm of unspecified site of left female breast: Secondary | ICD-10-CM

## 2012-10-03 DIAGNOSIS — C50512 Malignant neoplasm of lower-outer quadrant of left female breast: Secondary | ICD-10-CM

## 2012-10-03 DIAGNOSIS — C50519 Malignant neoplasm of lower-outer quadrant of unspecified female breast: Secondary | ICD-10-CM | POA: Insufficient documentation

## 2012-10-03 HISTORY — DX: Anxiety disorder, unspecified: F41.9

## 2012-10-03 NOTE — Telephone Encounter (Signed)
sw pt gv appt for 11/12/12 @9 :45am, due to pt wants to come in after the second week of Sept...td

## 2012-10-03 NOTE — Addendum Note (Signed)
Encounter addended by: Delynn Flavin, RN on: 10/03/2012  4:16 PM<BR>     Documentation filed: Charges VN

## 2012-10-03 NOTE — Addendum Note (Signed)
Encounter addended by: Delynn Flavin, RN on: 10/03/2012  4:16 PM<BR>     Documentation filed: Notes Section

## 2012-10-03 NOTE — Telephone Encounter (Signed)
Called patient  To inform of mammogram for 10-16-12 at 9:15 am, spoke with patient and she is aware of this appt.

## 2012-10-03 NOTE — Progress Notes (Signed)
Please see the Nurse Progress Note in the MD Initial Consult Encounter for this patient. 

## 2012-10-03 NOTE — Progress Notes (Signed)
Radiation Oncology         ([phone]) [phone] ________________________________  Initial outpatient Consultation  Name: Karen Lawrence MRN: [id number]  Date: 10/03/2012  DOB: 1955-05-20  WG:[id number],QMVHQI A, MD  Streck, Reola Mosher, MD   REFERRING PHYSICIAN: Currie Paris, MD  DIAGNOSIS: The primary encounter diagnosis was Malignant neoplasm of lower-outer quadrant of female breast, left. A diagnosis of Breast cancer, left breast, DCIS was also pertinent to this visit.  HISTORY OF PRESENT ILLNESS::Karen Lawrence is a 57 y.o. female who was found to have an 11cm area of suspicious califications in the Left breast her annual mammography in June 2014. Biopsy on 6-10 of the LOQ showed DCIS, ER 100%, PR 7%.  MRI of breasts in June revealed: Left breast: In the anterior third of the lateral aspect of the left breast there is post-biopsy changes and a signal void artifact from the biopsy clip. There is patchy non mass-like enhancement throughout the lower outer quadrant of the left breast measuring 13.1 x 5.6 x 6.0 cm in anterior-posterior, transverse and  longitudinal dimensions. There is no enlarged axillary or internal mammary adenopathy.  Additional Biopsy on 08-30-12 of the left breast was performed to determine extent of disease. It showed ER/PR + DCIS in the LOQ.  A large lumpectomy vs mastectomy were discussed.  Pt opted for large lumpectomy on 09/11/12.  See Path below.  Margins are negative.  She saw medical oncology earlier this week. I do not have access to documentation of medical oncology - note has not been authenticated. However the patient reports some reluctance about undergoing antiestrogen therapy. She is going to meet with Dr. Mia Creek of integrative medicine for a second opinion but does not yet have an appointment scheduled. The patient is interested in natural remedies. She has multiple questions regarding radiotherapy. She reports that she was quite fatigued before  surgery and that her energy levels have improved since surgery. She denies any prior cancers. She denies any prior radiotherapy. She denies hormonal supplementation.  Path revealed:    1. Breast, lumpectomy, Left - DUCTAL CARCINOMA IN SITU WITH CALCIFICATIONS, INTERMEDIATE GRADE, SPANNING AT LEAST 8.5 CM. - DUCTAL CARCINOMA IN SITU IS BROADLY LESS THAN 0.1 CM TO THE ANTERIOR MARGIN OF SPECIMEN #1. - DUCTAL CARCINOMA IN SITU IS BROADLY 0.1 CM TO THE INFERIOR MARGIN OF SPECIMEN #1. - LOBULAR NEOPLASIA (ATYPICAL LOBULAR HYPERPLASIA). - SEE ONCOLOGY TABLE BELOW. 2. Breast, excision, Left Posterior - DUCTAL CARCINOMA IN SITU, INTERMEDIATE GRADE. - DUCTAL CARCINOMA IN SITU IS GREATER THAN 0.2 CM TO THE NEW SURGICAL RESECTION MARGIN. 3. Breast, excision, Left medial and medial superior - BENIGN BREAST PARENCHYMA. - THERE IS NO EVIDENCE OF MALIGNANCY. - SEE COMMENT. 4. Breast, excision, Left inferior margin - DUCTAL CARCINOMA IN SITU, INTERMEDIATE GRADE. - DUCTAL CARCINOMA IN SITU IS GREATER THAN 0.2 CM TO THE NEW SURGICAL RESECTION MARGIN. 5. Breast, excision, Left lateral and lateral superior margin - LOBULAR NEOPLASM (ATYPICAL LOBULAR HYPERPLASIA). - SEE COMMENT.  Microscopic Comment 1. BREAST, IN SITU CARCINOMA Specimen, including laterality: Left breast Procedure: Needle localized lumpectomy Grade of carcinoma: Intermediate grade Necrosis: Present Estimated tumor size: (gross measurement): At least 8.5 cm Treatment effect: N/A Distance to closest margin: Broadly less than 0.1 cm to anterior margin of specimen #1 and broadly 0.1 cm to the inferior margin of specimen #1, see comment. Breast prognostic profile: [phone] and [id number] Estrogen receptor: 100% and 100%, strong staining intensity Progesterone receptor: 51% and 7%, strong and moderate staining intensity,  respectively Lymph nodes: None examined TNM: pTis, pNX Comments: There are two localized areas present  in the main lumpectomy specimen. The medial localized area consists of a 0.7 cm stellate lesion which harbors ductal carcinoma in situ. The lateral localized area is a 1.4 cm ill defined lesion which also harbors similar appearing ductal carcinoma in situ. Multiple random tissue sections are submitted from the area between the two localized areas and these sections harbor a significant amount of ductal carcinoma in situ throughout. Thus, the final span of ductal carcinoma in situ is deemed at least 8.5 cm. Although the inferior margin on specimen #1 is 0.1 cm, the final inferior margin (specimen #4) is clear, and therefore the final inferior margin is deemed negative for carcinoma.   PREVIOUS RADIATION THERAPY: No  PAST MEDICAL HISTORY:  has a past medical history of Breast cancer (07/2012); Hypertension; and Anxiety.    PAST SURGICAL HISTORY: Past Surgical History  Procedure Laterality Date  . Tubal ligation    . Wisdom tooth extraction  2004  . Breast lumpectomy with needle localization Left 09/11/2012    Procedure: BREAST LUMPECTOMY WITH NEEDLE LOCALIZATION removal of left breast cancer ;  Surgeon: Currie Paris, MD;  Location: [facility] OR;  Service: General;  Laterality: Left;    FAMILY HISTORY: family history includes Breast cancer (age of onset: 54) in her mother; Breast cancer (age of onset: 55) in her sister; Breast cancer (age of onset: 41) in her sister; Breast cancer (age of onset: 33) in her sister; Cancer in her father; Diabetes in her father and sister; HIV/AIDS in her brother; Prostate cancer (age of onset: 13) in her brother; and Uterine cancer in her paternal aunt.  SOCIAL HISTORY:  reports that she has never smoked. She has never used smokeless tobacco. She reports that she does not drink alcohol or use illicit drugs. Graduate education in education degree.  From [facility] originally. Works in [employer]. Married.  ALLERGIES: Review of patient's allergies indicates no known  allergies.  MEDICATIONS:  Current Outpatient Prescriptions  Medication Sig Dispense Refill  . cholecalciferol (VITAMIN D) 1000 UNITS tablet Take 1,000 Units by mouth daily.      . IODINE, KELP, PO Take 1 capsule by mouth daily.       . Multiple Vitamin (MULTIVITAMIN WITH MINERALS) TABS Take 1 tablet by mouth daily.      Marland Kitchen triamterene-hydrochlorothiazide (MAXZIDE-25) 37.5-25 MG per tablet Take 1 tablet by mouth daily. Pt taking 1/2 tablet       No current facility-administered medications for this encounter.    REVIEW OF SYSTEMS:   Pertinent items are noted in HPI.   PHYSICAL EXAM:  height is 5' 4.5" (1.638 m) and weight is 137 lb 14.4 oz (62.551 kg). Her oral temperature is 97.6 F (36.4 C). Her blood pressure is 146/93 and her pulse is 70. Her respiration is 20.   General: Alert and oriented, in no acute distress HEENT: Head is normocephalic. Pupils are equally round and reactive to light. Extraocular movements are intact. Oropharynx is clear. Neck: Neck is supple, no palpable cervical or supraclavicular lymphadenopathy. Heart: Regular in rate and rhythm with no murmurs, rubs, or gallops. Chest: Clear to auscultation bilaterally, with no rhonchi, wheezes, or rales. Abdomen: Soft, nontender, nondistended, with no rigidity or guarding. Extremities: No cyanosis or edema. Lymphatics: No concerning lymphadenopathy. Skin: No concerning lesions. Neurologic: Cranial nerves II through XII are grossly intact. No obvious focalities. Speech is fluent. Coordination is intact. Psychiatric: Judgment and  insight are intact. Affect is appropriate. Breasts: Left breast, lower outer quadrant, notable for healing lumpectomy scar. Left breast is a little bit swollen. Moderate seroma underneath the scar. No axillary adenopathy bilaterally. Right breast tissue is somewhat lumpy, but no discrete suspicious mass  LABORATORY DATA:  Lab Results  Component Value Date   WBC 4.7 10/01/2012   HGB 11.8 10/01/2012    HCT 36.1 10/01/2012   MCV 91.0 10/01/2012   PLT 205 10/01/2012   CMP     Component Value Date/Time   NA 141 10/01/2012 1245   NA 136 09/10/2012 1236   K 4.1 10/01/2012 1245   K 3.6 09/10/2012 1236   CL 101 09/10/2012 1236   CO2 25 10/01/2012 1245   CO2 25 09/10/2012 1236   GLUCOSE 82 10/01/2012 1245   GLUCOSE 95 09/10/2012 1236   BUN 13.1 10/01/2012 1245   BUN 18 09/10/2012 1236   CREATININE 0.9 10/01/2012 1245   CREATININE 0.94 09/10/2012 1236   CALCIUM 9.6 10/01/2012 1245   CALCIUM 9.7 09/10/2012 1236   PROT 7.7 10/01/2012 1245   PROT 7.8 09/10/2012 1236   ALBUMIN 4.0 10/01/2012 1245   ALBUMIN 4.3 09/10/2012 1236   AST 34 10/01/2012 1245   AST 30 09/10/2012 1236   ALT 32 10/01/2012 1245   ALT 26 09/10/2012 1236   ALKPHOS 59 10/01/2012 1245   ALKPHOS 59 09/10/2012 1236   BILITOT 0.50 10/01/2012 1245   BILITOT 0.4 09/10/2012 1236   GFRNONAA 66* 09/10/2012 1236   GFRAA 76* 09/10/2012 1236         RADIOGRAPHY: Chest 2 View  09/10/2012   *RADIOLOGY REPORT*  Clinical Data: Left-sided breast cancer.  CHEST - 2 VIEW  Comparison: None.  Findings: Heart size is normal.  The lungs are clear.  The visualized soft tissues and bony thorax are unremarkable.  IMPRESSION: Negative two-view chest radiograph.   Original Report Authenticated By: Marin Roberts, M.D.   Mm Lt Plc Breast Loc Dev   1st Lesion  Inc Mammo Guide  09/11/2012   *RADIOLOGY REPORT*  Clinical Data: Patient with left breast DCIS  NEEDLE LOCALIZATION WITH MAMMOGRAPHIC GUIDANCE AND SPECIMEN RADIOGRAPH  Comparison:  Priors including 08/30/2012  Patient presents for needle localization prior to lumpectomy. I met with the patient and we discussed the procedure of needle localization including benefits and alternatives. We discussed the high likelihood of a successful procedure. We discussed the risks of the procedure, including infection, bleeding, tissue injury, and further surgery. Informed, written consent was given. The usual time-out protocol was performed  immediately prior to the procedure.  Using mammographic guidance, sterile technique, 2% lidocaine and a 7 cm modified Kopans needle the posterior extent of the calcifications and posterior biopsy marking clip within the lateral left breast were localized using a lateral approach.  Additionally the anterior extent of the calcifications and anterior biopsy marking clip were localized using lateral approach and a 5 cm Kopans wire.  The films are marked for Dr. Jamey Ripa. Specimen radiograph was performed at Day Surgery and confirms the anterior and posterior biopsy marking clips as well as the posterior Kopans wire to be present in the tissue sample.  The specimen is marked for pathology.  IMPRESSION: Needle localization of calcifications and biopsy marking clips in the lateral left breast.  No apparent complications.   Original Report Authenticated By: Annia Belt, M.D      IMPRESSION/PLAN: It was a pleasure meeting Ms. Ellerbrock today. We discussed the risks, benefits, and side effects  of radiotherapy. I estimated that she has approximately 30% risk of recurring in her left breast over the next 20 years. Recurrence could be noninvasive or invasive. This risk could be cut down to about 15% with radiotherapy. We discussed that radiation would take approximately 6 weeks to complete.  I did urge her to strongly consider radiotherapy due to the extent of her DCIS and relatively close margins.  We discussed clinical trials and the data from such which support the use of radiotherapy.  We spoke about acute effects including skin irritation and fatigue as well as much less common late effects including lung and heart irritation. We spoke about the latest technology that is used to minimize the risk of late effects for breast cancer patients undergoing radiotherapy (ie breathhold technique to protect heart).   I discussed the patient's mammography / pathology with radiology. Recommendation was made for postoperative  mammogram. I will order for this to be done in the next couple weeks to rule out residual suspicious calcifications. Patient is willing to do this.  No guarantees of treatment were given. Ms. Benning would like to delay simulation until early September. She understands my goal is to start treatment within 4-8 weeks of radiotherapy and that if she wanted, we can simulate her sooner. She will try to meet with Dr. Ananias Pilgrim before then. She has signed a consent form and is leaning towards proceeding with treatment. She will let me know if she changes her mind. I gave her my card if more questions arise. I look forward to participating in the patient's care.  I spent 60 minutes  face to face with the patient and more than 50% of that time was spent in counseling and/or coordination of care.   __________________________________________   Lonie Peak, MD

## 2012-10-05 ENCOUNTER — Encounter (INDEPENDENT_AMBULATORY_CARE_PROVIDER_SITE_OTHER): Payer: Self-pay | Admitting: Surgery

## 2012-10-05 ENCOUNTER — Ambulatory Visit (INDEPENDENT_AMBULATORY_CARE_PROVIDER_SITE_OTHER): Payer: 59 | Admitting: Surgery

## 2012-10-05 VITALS — BP 138/88 | HR 68 | Temp 97.6°F | Resp 15 | Ht 64.5 in | Wt 136.2 lb

## 2012-10-05 DIAGNOSIS — Z09 Encounter for follow-up examination after completed treatment for conditions other than malignant neoplasm: Secondary | ICD-10-CM

## 2012-10-05 NOTE — Addendum Note (Signed)
Encounter addended by: Raine Elsass Mintz Starlit Raburn, RN on: 10/05/2012  4:17 PM<BR>     Documentation filed: Charges VN

## 2012-10-05 NOTE — Progress Notes (Signed)
NAME: Karen Lawrence                                            DOB: 02/10/1956 DATE: 10/05/2012                                                  MRN: [id number]  CC:  Chief Complaint  Patient presents with  . Routine Post Op    p/o reck lt NL br    HPI: This patient comes in for post op follow-up .Sheunderwent left lumpectomy on 09/11/12. She feels that she is doing well.  PE:  VITAL SIGNS: BP 138/88  Pulse 68  Temp(Src) 97.6 F (36.4 C) (Temporal)  Resp 15  Ht 5' 4.5" (1.638 m)  Wt 136 lb 3.2 oz (61.78 kg)  BMI 23.03 kg/m2  General: The patient appears to be healthy, NAD Incision healing well.   DATA REVIEWED: Path: 1. Breast, lumpectomy, Left - DUCTAL CARCINOMA IN SITU WITH CALCIFICATIONS, INTERMEDIATE GRADE, SPANNING AT LEAST 8.5 CM. - DUCTAL CARCINOMA IN SITU IS BROADLY LESS THAN 0.1 CM TO THE ANTERIOR MARGIN OF SPECIMEN #1. - DUCTAL CARCINOMA IN SITU IS BROADLY 0.1 CM TO THE INFERIOR MARGIN OF SPECIMEN #1. - LOBULAR NEOPLASIA (ATYPICAL LOBULAR HYPERPLASIA). - SEE ONCOLOGY TABLE BELOW. 2. Breast, excision, Left Posterior - DUCTAL CARCINOMA IN SITU, INTERMEDIATE GRADE. - DUCTAL CARCINOMA IN SITU IS GREATER THAN 0.2 CM TO THE NEW SURGICAL RESECTION MARGIN. 3. Breast, excision, Left medial and medial superior - BENIGN BREAST PARENCHYMA. - THERE IS NO EVIDENCE OF MALIGNANCY. - SEE COMMENT. 4. Breast, excision, Left inferior margin - DUCTAL CARCINOMA IN SITU, INTERMEDIATE GRADE. - DUCTAL CARCINOMA IN SITU IS GREATER THAN 0.2 CM TO THE NEW SURGICAL RESECTION MARGIN. 5. Breast, excision, Left lateral and lateral superior margin - LOBULAR NEOPLASM (ATYPICAL LOBULAR HYPERPLASIA). - SEE COMMENT.  IMPRESSION: The patient is doing well S/P left lumpectomy.    PLAN: Will see again in three months. P/O mammogram is scheduled. She is thinking about no radiation, which I think is a poor idea. She is seein Dr Alessandra Bevels in consultation.  I gave the patient a copy of the  pathology report and reviewed it with her

## 2012-10-05 NOTE — Patient Instructions (Signed)
See me again in 3 months.

## 2012-10-08 ENCOUNTER — Telehealth: Payer: Self-pay | Admitting: *Deleted

## 2012-10-08 NOTE — Telephone Encounter (Signed)
Pt called [facility] states " i had labs on 8/4 and would like to know results." Reviewed with MD, informed pt all Labs are normal. Pt requested copy of labs. Discussed with pt how to access mychart, gave phone number and email address. Pt thanked me for the call. No further questions

## 2012-10-15 ENCOUNTER — Telehealth: Payer: Self-pay | Admitting: Medical Oncology

## 2012-10-15 NOTE — Telephone Encounter (Signed)
Patient called requesting lab results from 08/04 to be faxed to [phone]. Fax sent per pt request. No further questions at this time.

## 2012-10-16 ENCOUNTER — Ambulatory Visit
Admission: RE | Admit: 2012-10-16 | Discharge: 2012-10-16 | Disposition: A | Discharge status: Home / Self Care | Payer: 59 | Source: Ambulatory Visit | Attending: Radiation Oncology | Admitting: Radiation Oncology

## 2012-10-16 ENCOUNTER — Telehealth (INDEPENDENT_AMBULATORY_CARE_PROVIDER_SITE_OTHER): Payer: Self-pay

## 2012-10-16 DIAGNOSIS — C50912 Malignant neoplasm of unspecified site of left female breast: Secondary | ICD-10-CM

## 2012-10-16 DIAGNOSIS — C50512 Malignant neoplasm of lower-outer quadrant of left female breast: Secondary | ICD-10-CM

## 2012-10-17 ENCOUNTER — Other Ambulatory Visit (INDEPENDENT_AMBULATORY_CARE_PROVIDER_SITE_OTHER): Payer: Self-pay

## 2012-10-30 ENCOUNTER — Other Ambulatory Visit: Payer: Self-pay | Admitting: Radiation Oncology

## 2012-10-30 ENCOUNTER — Other Ambulatory Visit (INDEPENDENT_AMBULATORY_CARE_PROVIDER_SITE_OTHER): Payer: Self-pay | Admitting: Surgery

## 2012-10-30 ENCOUNTER — Telehealth: Payer: Self-pay | Admitting: Oncology

## 2012-10-30 ENCOUNTER — Ambulatory Visit: Admission: RE | Admit: 2012-10-30 | Payer: 59 | Source: Ambulatory Visit | Admitting: Radiation Oncology

## 2012-10-30 DIAGNOSIS — R921 Mammographic calcification found on diagnostic imaging of breast: Secondary | ICD-10-CM

## 2012-10-30 NOTE — Progress Notes (Signed)
I called Karen Lawrence this AM prior to her simulation to discuss her post-op mammogram results.  She had originally told Dr. Tenna Child office that she was going to pursue a biopsy, but then decided against it.  I talked to Dr. Chester Holstein from radiology (Dr Lyman Bishop was on vacation) and gathered from him that the calcifications are relatively suspicious - but could be benign.  Dr. Jamey Ripa and I also spoke and we favor attempting a biopsy because if there is gross residual DCIS, further tissue excision is warranted for breast conservation. I explained to Karen Lawrence that radiation is not as likely to provide local control if she has gross residual DCIS compared to microscopic disease.  After lengthy discussion, pt is opting to call [facility] to schedule a biopsy. We will hold off on simulation until biopsy results come out.  ________________________________  Lonie Peak, MD

## 2012-10-30 NOTE — Telephone Encounter (Signed)
, °

## 2012-11-05 ENCOUNTER — Ambulatory Visit: Payer: 59 | Admitting: Adult Health

## 2012-11-12 ENCOUNTER — Ambulatory Visit: Payer: 59 | Admitting: Adult Health

## 2012-11-13 ENCOUNTER — Ambulatory Visit
Admission: RE | Admit: 2012-11-13 | Discharge: 2012-11-13 | Disposition: A | Discharge status: Home / Self Care | Payer: 59 | Source: Ambulatory Visit | Attending: Radiation Oncology | Admitting: Radiation Oncology

## 2012-11-13 DIAGNOSIS — R921 Mammographic calcification found on diagnostic imaging of breast: Secondary | ICD-10-CM

## 2012-11-19 ENCOUNTER — Ambulatory Visit: Payer: 59 | Admitting: Radiation Oncology

## 2012-11-21 ENCOUNTER — Ambulatory Visit (INDEPENDENT_AMBULATORY_CARE_PROVIDER_SITE_OTHER): Payer: 59 | Admitting: Surgery

## 2012-11-21 ENCOUNTER — Encounter (INDEPENDENT_AMBULATORY_CARE_PROVIDER_SITE_OTHER): Payer: Self-pay | Admitting: Surgery

## 2012-11-21 VITALS — BP 148/90 | HR 72 | Temp 98.4°F | Resp 14 | Ht 64.5 in | Wt 136.8 lb

## 2012-11-21 DIAGNOSIS — Z09 Encounter for follow-up examination after completed treatment for conditions other than malignant neoplasm: Secondary | ICD-10-CM

## 2012-11-21 NOTE — Patient Instructions (Signed)
See me again after radiation

## 2012-11-21 NOTE — Progress Notes (Addendum)
This patient comes back to followup a recent needle core biopsy. She had a large lumpectomy for a extensive area of DCIS in the left breast. A followup mammogram showed some calcifications fairly anterior. These were recently biopsied. The radiologist report indicated that some atypical cells were found and further excision was recommended.  However, review the actual pathology report indicates that the calcifications were benign, and there was no atypia found. I discussed this with the patient and given her a copy of her pathology report. I discussed and confirmed with the pathologist that indeed there was no atypia or carcinoma found. I discussed this with the radiation oncologist, Dr. Boykin Reaper, we'll go ahead and do radiation therapy planning.I discussed this with the breast center and they will make an addendum to the original radiology report.  Plan to see her back following radiation therapy.

## 2012-11-22 ENCOUNTER — Telehealth: Payer: Self-pay | Admitting: Oncology

## 2012-11-22 NOTE — Telephone Encounter (Signed)
, °

## 2012-11-23 ENCOUNTER — Ambulatory Visit
Admission: RE | Admit: 2012-11-23 | Discharge: 2012-11-23 | Disposition: A | Discharge status: Home / Self Care | Payer: 59 | Source: Ambulatory Visit | Attending: Radiation Oncology | Admitting: Radiation Oncology

## 2012-11-23 DIAGNOSIS — D0590 Unspecified type of carcinoma in situ of unspecified breast: Secondary | ICD-10-CM | POA: Insufficient documentation

## 2012-11-23 DIAGNOSIS — C50512 Malignant neoplasm of lower-outer quadrant of left female breast: Secondary | ICD-10-CM

## 2012-11-23 DIAGNOSIS — Z510 Encounter for antineoplastic radiation therapy: Secondary | ICD-10-CM | POA: Insufficient documentation

## 2012-11-23 NOTE — Progress Notes (Signed)
  Radiation Oncology         [phone] ________________________________  Name: Karen Lawrence MRN: [id number]  Date: 11/23/2012  DOB: 06-23-1955  SIMULATION AND TREATMENT PLANNING NOTE  Outpatient  DIAGNOSIS:  Left breast DCIS   Pathology reviewed and discussed with Dr. Jamey Ripa.  No need for further surgery. Breast, left, needle core biopsy - BENIGN BREAST TISSUE, SEE COMMENT. - NEGATIVE FOR ATYPIA OR MALIGNANCY. - MICROCALCIFICATIONS IDENTIFIED.  NARRATIVE:  The patient was brought to the CT Simulation planning suite.  Identity was confirmed.  All relevant records and images related to the planned course of therapy were reviewed.  The patient freely provided informed written consent to proceed with treatment after reviewing the details related to the planned course of therapy. The consent form was witnessed and verified by the simulation staff.    Then, the patient was set-up in a stable reproducible  supine position for radiation therapy.  CT images were obtained.  Surface markings were placed.  The CT images were loaded into the planning software.    RESPIRATORY MOTION MANAGEMENT SIMULATION  NARRATIVE:  In order to account for effect of respiratory motion on target structures and other organs in the planning and delivery of radiotherapy, this patient underwent respiratory motion management simulation.  To accomplish this, when the patient was brought to the CT simulation planning suite, 4D respiratory motion management CT images were obtained.  The CT images were loaded into the planning software.  Then, using a variety of tools including Cine, MIP, and standard views, the target volume was delineated.  Avoidance structures were contoured.  Treatment planning then occurred.     TREATMENT PLANNING NOTE: Treatment planning then occurred.  The radiation prescription was entered and confirmed.    A total of 3 medically necessary complex treatment devices were fabricated and supervised by  me - accuform for head, and two tangential fields with MLCs to block heart/lung. I have requested : 3D Simulation  I have requested a DVH of the following structures: lungs, heart, lumpectomy cavity.    The patient will receive 40.05 Gy in 15 fractions to her whole breast with opposed tangential fields. This will be followed by a boost to the lumpectomy cavity (I anticipate with a 3 field photon plan) to an additional 10 Gy in 5 fractions.   -----------------------------------  Lonie Peak, MD

## 2012-11-26 ENCOUNTER — Ambulatory Visit: Payer: 59 | Admitting: Adult Health

## 2012-11-26 ENCOUNTER — Encounter: Payer: Self-pay | Admitting: Radiation Oncology

## 2012-11-27 ENCOUNTER — Encounter: Payer: Self-pay | Admitting: *Deleted

## 2012-11-27 ENCOUNTER — Ambulatory Visit
Admission: RE | Admit: 2012-11-27 | Discharge: 2012-11-27 | Disposition: A | Discharge status: Home / Self Care | Payer: 59 | Source: Ambulatory Visit | Attending: Radiation Oncology | Admitting: Radiation Oncology

## 2012-11-27 DIAGNOSIS — C50512 Malignant neoplasm of lower-outer quadrant of left female breast: Secondary | ICD-10-CM

## 2012-11-27 NOTE — Progress Notes (Signed)
Mailed after appt letter to pt. 

## 2012-11-27 NOTE — Progress Notes (Signed)
Simulation Verification Note Outpatient Left Breast  The patient was brought to the treatment unit and placed in the planned treatment position. The clinical setup was verified. Then port films were obtained and uploaded to the radiation oncology medical record software.  The treatment beams were carefully compared against the planned radiation fields. The position location and shape of the radiation fields was reviewed. They targeted volume of tissue appears to be appropriately covered by the radiation beams. Organs at risk appear to be excluded as planned.  Based on my personal review, I approved the simulation verification. The patient's treatment will proceed as planned.  -----------------------------------  Lonie Peak, MD

## 2012-11-28 ENCOUNTER — Ambulatory Visit
Admission: RE | Admit: 2012-11-28 | Discharge: 2012-11-28 | Disposition: A | Discharge status: Home / Self Care | Payer: 59 | Source: Ambulatory Visit | Attending: Radiation Oncology | Admitting: Radiation Oncology

## 2012-11-29 ENCOUNTER — Ambulatory Visit
Admission: RE | Admit: 2012-11-29 | Discharge: 2012-11-29 | Disposition: A | Discharge status: Home / Self Care | Payer: 59 | Source: Ambulatory Visit | Attending: Radiation Oncology | Admitting: Radiation Oncology

## 2012-11-30 ENCOUNTER — Ambulatory Visit
Admission: RE | Admit: 2012-11-30 | Discharge: 2012-11-30 | Disposition: A | Discharge status: Home / Self Care | Payer: 59 | Source: Ambulatory Visit | Attending: Radiation Oncology | Admitting: Radiation Oncology

## 2012-12-03 ENCOUNTER — Ambulatory Visit
Admission: RE | Admit: 2012-12-03 | Discharge: 2012-12-03 | Disposition: A | Discharge status: Home / Self Care | Payer: 59 | Source: Ambulatory Visit | Attending: Radiation Oncology | Admitting: Radiation Oncology

## 2012-12-03 ENCOUNTER — Encounter: Payer: Self-pay | Admitting: Radiation Oncology

## 2012-12-03 VITALS — BP 149/80 | HR 73 | Temp 98.4°F | Ht 64.5 in | Wt 136.2 lb

## 2012-12-03 DIAGNOSIS — C50512 Malignant neoplasm of lower-outer quadrant of left female breast: Secondary | ICD-10-CM

## 2012-12-03 NOTE — Progress Notes (Signed)
   Weekly Management Note:  outpatient Current Dose:  10.68 Gy  Projected Dose: 50.05 Gy   Narrative:  The patient presents for routine under treatment assessment.  CBCT/MVCT images/Port film x-rays were reviewed.  The chart was checked. No new complaints  Physical Findings:  height is 5' 4.5" (1.638 m) and weight is 136 lb 3.2 oz (61.78 kg). Her temperature is 98.4 F (36.9 C). Her blood pressure is 149/80 and her pulse is 73.  NAD, mild hyperpigmentation over left breast with mild dryness.  Impression:  The patient is tolerating radiotherapy.  Plan:  Continue radiotherapy as planned.  Continue radiaplex ________________________________   Lonie Peak, M.D.

## 2012-12-03 NOTE — Progress Notes (Signed)
Karen Lawrence has received 4 fractions to her left breast.  She has no voiced concerns.  Education  Today regarding management of side-effects related to radiation tho the breast to include pain, skin  And fatigue.  Given Radiation therapy and You booklet with pages marked.

## 2012-12-04 ENCOUNTER — Ambulatory Visit
Admission: RE | Admit: 2012-12-04 | Discharge: 2012-12-04 | Disposition: A | Discharge status: Home / Self Care | Payer: 59 | Source: Ambulatory Visit | Attending: Radiation Oncology | Admitting: Radiation Oncology

## 2012-12-05 ENCOUNTER — Ambulatory Visit
Admission: RE | Admit: 2012-12-05 | Discharge: 2012-12-05 | Disposition: A | Discharge status: Home / Self Care | Payer: 59 | Source: Ambulatory Visit | Attending: Radiation Oncology | Admitting: Radiation Oncology

## 2012-12-06 ENCOUNTER — Ambulatory Visit
Admission: RE | Admit: 2012-12-06 | Discharge: 2012-12-06 | Disposition: A | Discharge status: Home / Self Care | Payer: 59 | Source: Ambulatory Visit | Attending: Radiation Oncology | Admitting: Radiation Oncology

## 2012-12-07 ENCOUNTER — Ambulatory Visit
Admission: RE | Admit: 2012-12-07 | Discharge: 2012-12-07 | Disposition: A | Discharge status: Home / Self Care | Payer: 59 | Source: Ambulatory Visit | Attending: Radiation Oncology | Admitting: Radiation Oncology

## 2012-12-10 ENCOUNTER — Ambulatory Visit
Admission: RE | Admit: 2012-12-10 | Discharge: 2012-12-10 | Disposition: A | Discharge status: Home / Self Care | Payer: 59 | Source: Ambulatory Visit | Attending: Radiation Oncology | Admitting: Radiation Oncology

## 2012-12-10 ENCOUNTER — Ambulatory Visit: Payer: 59 | Admitting: Radiation Oncology

## 2012-12-10 ENCOUNTER — Encounter: Payer: Self-pay | Admitting: Radiation Oncology

## 2012-12-10 VITALS — BP 127/85 | HR 74 | Temp 98.2°F | Ht 64.5 in | Wt 134.3 lb

## 2012-12-10 DIAGNOSIS — C50512 Malignant neoplasm of lower-outer quadrant of left female breast: Secondary | ICD-10-CM

## 2012-12-10 NOTE — Progress Notes (Signed)
   Weekly Management Note:  outpatient Current Dose:  24.03 Gy  Projected Dose: 40.05 Gy  + boost  Narrative:  The patient presents for routine under treatment assessment.  CBCT/MVCT images/Port film x-rays were reviewed.  The chart was checked. Reports that she has shooting pains that come/go in left breast. Using olive oil over skin, occasional radiaplex.  She says she doesn't feel like herself.  Sometimes feels like heart is fluttering.  She is concerned this is from RT.  Denies lightheadedness/chest pressure/chest pain/diaphoresis/sob.  Physical Findings:  height is 5' 4.5" (1.638 m) and weight is 134 lb 4.8 oz (60.918 kg). Her temperature is 98.2 F (36.8 C). Her blood pressure is 127/85 and her pulse is 74.  left breast is hyperpigmented, dry skin. Heart RRR no murmurs.  Impression:  The patient is tolerating radiotherapy.  Plan:  Continue radiotherapy as planned. Recommended radiaplex. If she prefers olive oil or something more "natural" I suggested aloe vera in lieu of olive oil.   Shooting pains in breast are normal during and after RT. Suggested she see her PCP regarding her "heart fluttering."  I would not expect this from RT.   She would rather wait and see. Advised her to definitely call PCP if developing pain radiating to left neck or arm/lightheadedness/chest pressure/chest pain/diaphoresis/sob.  Reeducated patient on how we are shielding her heart from excessive RT. ________________________________   Lonie Peak, M.D.

## 2012-12-10 NOTE — Progress Notes (Addendum)
Karen Lawrence has received 9 fractions to her left breast.  She denies any pain in her breast today but reports intermittent shooting pains in her breast.  Note hyperpigmentation left breast, but skin remains intact.

## 2012-12-11 ENCOUNTER — Ambulatory Visit
Admission: RE | Admit: 2012-12-11 | Discharge: 2012-12-11 | Disposition: A | Discharge status: Home / Self Care | Payer: 59 | Source: Ambulatory Visit | Attending: Radiation Oncology | Admitting: Radiation Oncology

## 2012-12-12 ENCOUNTER — Ambulatory Visit
Admission: RE | Admit: 2012-12-12 | Discharge: 2012-12-12 | Disposition: A | Discharge status: Home / Self Care | Payer: 59 | Source: Ambulatory Visit | Attending: Radiation Oncology | Admitting: Radiation Oncology

## 2012-12-13 ENCOUNTER — Ambulatory Visit
Admission: RE | Admit: 2012-12-13 | Discharge: 2012-12-13 | Disposition: A | Discharge status: Home / Self Care | Payer: 59 | Source: Ambulatory Visit | Attending: Radiation Oncology | Admitting: Radiation Oncology

## 2012-12-13 NOTE — Telephone Encounter (Signed)
error 

## 2012-12-14 ENCOUNTER — Ambulatory Visit
Admission: RE | Admit: 2012-12-14 | Discharge: 2012-12-14 | Disposition: A | Discharge status: Home / Self Care | Payer: 59 | Source: Ambulatory Visit | Attending: Radiation Oncology | Admitting: Radiation Oncology

## 2012-12-17 ENCOUNTER — Encounter: Payer: Self-pay | Admitting: Radiation Oncology

## 2012-12-17 ENCOUNTER — Ambulatory Visit
Admission: RE | Admit: 2012-12-17 | Discharge: 2012-12-17 | Disposition: A | Discharge status: Home / Self Care | Payer: 59 | Source: Ambulatory Visit | Attending: Radiation Oncology | Admitting: Radiation Oncology

## 2012-12-17 VITALS — BP 135/83 | HR 72 | Temp 97.7°F | Ht 64.5 in | Wt 135.8 lb

## 2012-12-17 DIAGNOSIS — C50512 Malignant neoplasm of lower-outer quadrant of left female breast: Secondary | ICD-10-CM

## 2012-12-17 NOTE — Progress Notes (Signed)
Karen Lawrence has received 14 fractions to her left breast and will complete tomorrow.  The skin on the left  breast is dry, but intact and demonstrates hyperpigmentation.  Her left inframmary fod is clear.  She denies any pain nor fatigue.

## 2012-12-17 NOTE — Progress Notes (Signed)
   Weekly Management Note:  outpatient Current Dose:  37.38 Gy  Projected Dose: 40.05 Gy  + boost  Narrative:  The patient presents for routine under treatment assessment.  CBCT/MVCT images/Port film x-rays were reviewed.  The chart was checked. Feeling good today.  In good spirits, no complaints  Physical Findings:  height is 5' 4.5" (1.638 m) and weight is 135 lb 12.8 oz (61.598 kg). Her temperature is 97.7 F (36.5 C). Her blood pressure is 135/83 and her pulse is 72.  left breast - skin is dry, hyperpigmented, intact.  Impression:  The patient is tolerating radiotherapy.  Plan:  Continue radiotherapy as planned.   ________________________________   Lonie Peak, M.D.

## 2012-12-18 ENCOUNTER — Encounter: Payer: Self-pay | Admitting: Radiation Oncology

## 2012-12-18 ENCOUNTER — Ambulatory Visit
Admission: RE | Admit: 2012-12-18 | Discharge: 2012-12-18 | Disposition: A | Discharge status: Home / Self Care | Payer: 59 | Source: Ambulatory Visit | Attending: Radiation Oncology | Admitting: Radiation Oncology

## 2012-12-18 NOTE — Progress Notes (Signed)
Photon Boost Complex [employer] consultant Note  Diagnosis: Left Breast DCIS  The patient's CT images from her free-breathing simulation were reviewed to plan her boost treatment to her left breast  lumpectomy cavity.  The boost to the lumpectomy cavity will be delivered with 3 photon fields using MLCs for custom blocks against lung, heart with 6 MV photon energy.  10 Gy in 5 fractions prescribed to 100% IDL.    -----------------------------------  Lonie Peak, MD

## 2012-12-19 ENCOUNTER — Ambulatory Visit
Admission: RE | Admit: 2012-12-19 | Discharge: 2012-12-19 | Disposition: A | Discharge status: Home / Self Care | Payer: 59 | Source: Ambulatory Visit | Attending: Radiation Oncology | Admitting: Radiation Oncology

## 2012-12-19 ENCOUNTER — Encounter: Payer: Self-pay | Admitting: Radiation Oncology

## 2012-12-19 NOTE — Progress Notes (Signed)
Simulation Verification Note Outpatient Breast boost  The patient was brought to the treatment unit and placed in the planned treatment position. The clinical setup was verified. Then port films were obtained and uploaded to the radiation oncology medical record software.  The treatment beams were carefully compared against the planned radiation fields. The position location and shape of the radiation fields was reviewed. They targeted volume of tissue appears to be appropriately covered by the radiation beams. Organs at risk appear to be excluded as planned.  Based on my personal review, I approved the simulation verification. The patient's treatment will proceed as planned.  -----------------------------------  Lonie Peak, MD

## 2012-12-20 ENCOUNTER — Ambulatory Visit
Admission: RE | Admit: 2012-12-20 | Discharge: 2012-12-20 | Disposition: A | Discharge status: Home / Self Care | Payer: 59 | Source: Ambulatory Visit | Attending: Radiation Oncology | Admitting: Radiation Oncology

## 2012-12-21 ENCOUNTER — Ambulatory Visit
Admission: RE | Admit: 2012-12-21 | Discharge: 2012-12-21 | Disposition: A | Discharge status: Home / Self Care | Payer: 59 | Source: Ambulatory Visit | Attending: Radiation Oncology | Admitting: Radiation Oncology

## 2012-12-24 ENCOUNTER — Encounter: Payer: Self-pay | Admitting: Radiation Oncology

## 2012-12-24 ENCOUNTER — Ambulatory Visit
Admission: RE | Admit: 2012-12-24 | Discharge: 2012-12-24 | Disposition: A | Discharge status: Home / Self Care | Payer: 59 | Source: Ambulatory Visit | Attending: Radiation Oncology | Admitting: Radiation Oncology

## 2012-12-24 VITALS — BP 146/79 | HR 74 | Temp 97.6°F | Ht 64.5 in | Wt 135.4 lb

## 2012-12-24 DIAGNOSIS — C50512 Malignant neoplasm of lower-outer quadrant of left female breast: Secondary | ICD-10-CM

## 2012-12-24 NOTE — Progress Notes (Signed)
   Weekly Management Note  outpatient  Completed Radiotherapy. Total Dose:  48.05Gy /50.Jayson.Pel  Narrative:  The patient presents for routine under treatment assessment on last day of radiotherapy.  CBCT/MVCT images/Port film x-rays were reviewed.  The chart was checked. She has one more fraction to go. Doing well.  Physical Findings:  height is 5' 4.5" (1.638 m) and weight is 135 lb 6.4 oz (61.417 kg). Her temperature is 97.6 F (36.4 C). Her blood pressure is 146/79 and her pulse is 74.   Left breast hyperpigmented, dry, skin intact.  Impression:  The patient has tolerated radiotherapy.  Plan:  1 more treatment to go, then routine follow-up in one month with Dr. Michell Heinrich (pt prefers a female MD). I will also order Mammography in June and f/u with me. She is opting against anti-estrogen pills and pursuing alternative medicines with Dr Ananias Pilgrim.  She will continue aloe over her breast x 2 more weeks, then Vit E oil. ________________________________   Lonie Peak, M.D.

## 2012-12-24 NOTE — Progress Notes (Signed)
Karen Lawrence has received 3/4 fractions to her left breast.  She denies any pain and fatigue.  Marked hyperpigmentation and dryness to left breast, but skin remains intact.

## 2012-12-25 ENCOUNTER — Encounter: Payer: Self-pay | Admitting: Radiation Oncology

## 2012-12-25 ENCOUNTER — Ambulatory Visit
Admission: RE | Admit: 2012-12-25 | Discharge: 2012-12-25 | Disposition: A | Discharge status: Home / Self Care | Payer: 59 | Source: Ambulatory Visit | Attending: Radiation Oncology | Admitting: Radiation Oncology

## 2012-12-26 ENCOUNTER — Ambulatory Visit: Payer: 59

## 2012-12-27 ENCOUNTER — Ambulatory Visit: Payer: 59

## 2012-12-28 ENCOUNTER — Ambulatory Visit: Payer: 59

## 2012-12-30 NOTE — Progress Notes (Signed)
  Radiation Oncology         [phone] ________________________________  Name: Karen Lawrence MRN: [id number]  Date: 12/25/2012  DOB: 10-06-1955  End of Treatment Note  Diagnosis:  left breast, DCIS - ER100%, PR 7%    Indication for treatment:  curative      Radiation treatment dates:   11/28/2012-12/25/2012  Site/dose:  1)  Left Breast/  40.05 Gy in 15 fractions 2) Left Breast Boost / 10 Gy in 5 fractions  Beams/energy:   1) opposed tangents / 10 and 6 MV photons 2) 3 fields / 6 MV photons  Narrative: The patient tolerated radiation treatment relatively well.  Left breast became hyperpigmented, dry, with skin intact.   Plan: The patient has completed radiation treatment. The patient will return to radiation oncology clinic for routine followup in one month. I advised them to call or return sooner if they have any questions or concerns related to their recovery or treatment.  Follow-up in one month with Dr. Michell Heinrich (pt prefers a female MD). I will also order Mammography in June and f/u with me. She is opting against anti-estrogen pills and pursuing alternative medicines with Dr Ananias Pilgrim. She will continue aloe over her breast x 2 more weeks, then Vit E oil.   -----------------------------------  Lonie Peak, MD

## 2012-12-31 ENCOUNTER — Encounter (INDEPENDENT_AMBULATORY_CARE_PROVIDER_SITE_OTHER): Payer: Self-pay | Admitting: Surgery

## 2012-12-31 ENCOUNTER — Ambulatory Visit: Payer: 59

## 2013-01-01 ENCOUNTER — Ambulatory Visit: Payer: 59

## 2013-01-02 ENCOUNTER — Ambulatory Visit: Payer: 59

## 2013-01-02 NOTE — Progress Notes (Signed)
RESPIRATORY MOTION MANAGEMENT SIMULATION   NARRATIVE: In order to account for effect of respiratory motion on target structures and other organs in the planning and delivery of radiotherapy, this patient underwent respiratory motion management simulation on 9-26. To accomplish this, when the patient was brought to the CT simulation planning suite, 4D respiratory motion management CT images were obtained. The CT images were loaded into the planning software. Then, using a variety of tools including Cine, MIP, and standard views, the target volume was delineated. Avoidance structures were contoured.   These 4D CT images were used for her plan, which was approved today. Code 52841. -----------------------------------  Lonie Peak, MD

## 2013-01-03 ENCOUNTER — Other Ambulatory Visit: Payer: Self-pay

## 2013-01-03 ENCOUNTER — Ambulatory Visit: Payer: 59

## 2013-01-04 ENCOUNTER — Ambulatory Visit: Payer: 59

## 2013-01-04 ENCOUNTER — Encounter (INDEPENDENT_AMBULATORY_CARE_PROVIDER_SITE_OTHER): Payer: Self-pay | Admitting: Surgery

## 2013-01-04 ENCOUNTER — Ambulatory Visit (INDEPENDENT_AMBULATORY_CARE_PROVIDER_SITE_OTHER): Payer: 59 | Admitting: Surgery

## 2013-01-04 VITALS — BP 138/76 | HR 74 | Temp 97.2°F | Resp 14 | Ht 64.5 in | Wt 133.2 lb

## 2013-01-04 DIAGNOSIS — C50912 Malignant neoplasm of unspecified site of left female breast: Secondary | ICD-10-CM

## 2013-01-04 DIAGNOSIS — C50919 Malignant neoplasm of unspecified site of unspecified female breast: Secondary | ICD-10-CM

## 2013-01-04 NOTE — Patient Instructions (Signed)
Have bilateral mammograms when you're due for your routine annual checkup.see [place] again next year.

## 2013-01-04 NOTE — Progress Notes (Signed)
She comes in for followup today, status post a large left lumpectomy for a large area of DCIS. She has now completed her radiation therapy which she tolerated well. She is not having any problems.  On exam the right breast is completely normal. The left breast is smaller than the right. The skin is much darker from the radiation. There is some mild firmness at the lumpectomy site. Otherwise everything appears to be healed nicely.  I will have her come back in about 9 months for a followup. She should have her mammogram prior to coming back. I discussed that with her.

## 2013-01-07 ENCOUNTER — Ambulatory Visit: Payer: 59

## 2013-01-08 ENCOUNTER — Ambulatory Visit: Payer: 59

## 2013-01-09 ENCOUNTER — Ambulatory Visit: Payer: 59

## 2013-01-10 ENCOUNTER — Ambulatory Visit: Payer: 59

## 2013-01-11 ENCOUNTER — Encounter: Payer: Self-pay | Admitting: Oncology

## 2013-01-11 ENCOUNTER — Ambulatory Visit: Payer: 59

## 2013-01-14 ENCOUNTER — Ambulatory Visit: Payer: 59

## 2013-01-14 ENCOUNTER — Ambulatory Visit
Admission: RE | Admit: 2013-01-14 | Discharge: 2013-01-14 | Disposition: A | Discharge status: Home / Self Care | Payer: 59 | Source: Ambulatory Visit | Attending: Radiation Oncology | Admitting: Radiation Oncology

## 2013-01-14 ENCOUNTER — Encounter: Payer: Self-pay | Admitting: Radiation Oncology

## 2013-01-14 VITALS — BP 132/79 | HR 85 | Temp 98.2°F | Ht 64.5 in | Wt 134.0 lb

## 2013-01-14 DIAGNOSIS — C50912 Malignant neoplasm of unspecified site of left female breast: Secondary | ICD-10-CM

## 2013-01-14 NOTE — Progress Notes (Signed)
  Radiation Oncology         [phone] ________________________________  Name: Karen Lawrence MRN: [id number]  Date: 01/14/2013  DOB: August 26, 1955  Follow-Up Visit Note  CC: Emeterio Reeve, MD  Emeterio Reeve, MD  Diagnosis:   Intraductal carcinoma of the left breast  Interval Since Last Radiation:  1  months  Narrative:  The patient returns today for routine follow-up.  She is doing well and without complaints. She has elected not to proceed with adjuvant hormonal therapy but will continue followup with medical oncology.  she denies any itching or discomfort in the breast area.                              ALLERGIES:  has No Known Allergies.  Meds: Current Outpatient Prescriptions  Medication Sig Dispense Refill  . cholecalciferol (VITAMIN D) 1000 UNITS tablet Take 1,000 Units by mouth daily.      . IODINE, KELP, PO Take 1 capsule by mouth daily.       . Multiple Vitamin (MULTIVITAMIN WITH MINERALS) TABS Take 1 tablet by mouth daily.      Marland Kitchen triamterene-hydrochlorothiazide (MAXZIDE-25) 37.5-25 MG per tablet Take 1 tablet by mouth daily. Pt taking 1/2 tablet      . hyaluronate sodium (RADIAPLEXRX) GEL Apply 1 application topically 2 (two) times daily.      . non-metallic deodorant Thornton Papas) MISC Apply 1 application topically daily.       No current facility-administered medications for this encounter.    Physical Findings: The patient is in no acute distress. Patient is alert and oriented.  height is 5' 4.5" (1.638 m) and weight is 134 lb (60.782 kg). Her temperature is 98.2 F (36.8 C). Her blood pressure is 132/79 and her pulse is 85. Marland Kitchen  No palpable supraclavicular or axillary adenopathy. The lungs are clear to auscultation. The heart has a regular rhythm and rate. Examination of the right breast reveals it to be large and pendulous without mass or nipple discharge. Examination left breast reveals significant hyperpigmentation changes.  There is no skin breakdown noted.  there  is no dominant mass appreciated in the breast nipple discharge or bleeding.  Lab Findings: Lab Results  Component Value Date   WBC 4.7 10/01/2012   HGB 11.8 10/01/2012   HCT 36.1 10/01/2012   MCV 91.0 10/01/2012   PLT 205 10/01/2012      Radiographic Findings: No results found.  Impression:  The patient is recovering from the effects of radiation.  No evidence of recurrence on clinical exam today.  Plan:  Routine followup in 9 months with Dr. Basilio Cairo  _____________________________________  -----------------------------------  Karen Lade, PhD, MD

## 2013-01-14 NOTE — Progress Notes (Signed)
Karen Lawrence here for follow up after treatment to her left breast.  She denies pain and fatigue.  The skin on her left breast has hyperpigmentation with a small amount of peeling underneath her left breast.  She is using argentine (?) organic oil.

## 2013-01-15 ENCOUNTER — Ambulatory Visit: Payer: 59

## 2013-01-16 ENCOUNTER — Ambulatory Visit: Payer: 59

## 2013-01-17 ENCOUNTER — Ambulatory Visit: Payer: Self-pay | Admitting: Radiation Oncology

## 2013-04-03 ENCOUNTER — Telehealth: Payer: Self-pay | Admitting: Oncology

## 2013-04-03 ENCOUNTER — Other Ambulatory Visit: Payer: Self-pay | Admitting: Radiation Oncology

## 2013-04-03 DIAGNOSIS — Z853 Personal history of malignant neoplasm of breast: Secondary | ICD-10-CM

## 2013-04-03 DIAGNOSIS — C50512 Malignant neoplasm of lower-outer quadrant of left female breast: Secondary | ICD-10-CM

## 2013-04-03 NOTE — Telephone Encounter (Signed)
, °

## 2013-08-08 ENCOUNTER — Other Ambulatory Visit: Payer: Self-pay | Admitting: Gastroenterology

## 2013-08-15 ENCOUNTER — Ambulatory Visit
Admission: RE | Admit: 2013-08-15 | Discharge: 2013-08-15 | Disposition: A | Discharge status: Home / Self Care | Payer: 59 | Source: Ambulatory Visit | Attending: Radiation Oncology | Admitting: Radiation Oncology

## 2013-08-15 DIAGNOSIS — Z853 Personal history of malignant neoplasm of breast: Secondary | ICD-10-CM

## 2013-08-16 ENCOUNTER — Encounter: Payer: Self-pay | Admitting: Radiation Oncology

## 2013-08-16 ENCOUNTER — Ambulatory Visit
Admission: RE | Admit: 2013-08-16 | Discharge: 2013-08-16 | Disposition: A | Discharge status: Home / Self Care | Payer: 59 | Source: Ambulatory Visit | Attending: Radiation Oncology | Admitting: Radiation Oncology

## 2013-08-16 VITALS — BP 169/80 | HR 62 | Temp 98.0°F | Ht 64.5 in

## 2013-08-16 DIAGNOSIS — C50512 Malignant neoplasm of lower-outer quadrant of left female breast: Secondary | ICD-10-CM

## 2013-08-16 NOTE — Progress Notes (Signed)
Radiation Oncology         [phone]) [phone] ________________________________  Name: Karen Lawrence MRN: [id number]  Date: 08/16/2013  DOB: [birth year]  Follow-Up Visit Note  Outpatient  CC: Lilian Coma, MD  Haywood Lasso, MD  Diagnosis and Prior Radiotherapy:   left breast, DCIS - ER100%, PR 7%  Indication for treatment: curative  Radiation treatment dates: 11/28/2012-12/25/2012  Site/dose: 1) Left Breast/ 40.05 Gy in 15 fractions  2) Left Breast Boost / 10 Gy in 5 fractions    Narrative:  The patient returns today for routine follow-up. She feels well, not on any anti-estrogen, and still receiving holistic treatments.  She has no complaints. Mammography is BIRADS 2.                           ALLERGIES:  has No Known Allergies.  Meds: Current Outpatient Prescriptions  Medication Sig Dispense Refill  . cholecalciferol (VITAMIN D) 1000 UNITS tablet Take 1,000 Units by mouth daily.      . IODINE, KELP, PO Take 1 capsule by mouth daily.       . Multiple Vitamin (MULTIVITAMIN WITH MINERALS) TABS Take 1 tablet by mouth daily.      Marland Kitchen triamterene-hydrochlorothiazide (MAXZIDE-25) 37.5-25 MG per tablet Take 1 tablet by mouth daily. Pt taking 1/2 tablet      . non-metallic deodorant (ALRA) MISC Apply 1 application topically daily.       No current facility-administered medications for this encounter.    Physical Findings: The patient is in no acute distress. Patient is alert and oriented.  height is 5' 4.5" (1.638 m). Her temperature is 98 F (36.7 C). Her blood pressure is 169/80 and her pulse is 62. .   Breasts - no palpable lesions of concern.  Left breast - residual hyperpigmentation, and fibrosis under lumpectomy scar. Neck - no cervical or supraclavicular adenopathy. No axillary adenopathy.   Lab Findings: Lab Results  Component Value Date   WBC 4.7 10/01/2012   HGB 11.8 10/01/2012   HCT 36.1 10/01/2012   MCV 91.0 10/01/2012   PLT 205 10/01/2012    Radiographic  Findings: Mm Diag Breast Tomo Bilateral  08/15/2013   CLINICAL DATA:  Patient is a currently asymptomatic 58 year old female with a personal history of left breast cancer status post conservation therapy with lumpectomy and radiation in 2014. She also had a benign stereotactic guided core needle biopsy performed in 2014.  EXAM: DIGITAL DIAGNOSTIC BILATERAL MAMMOGRAM WITH TOMOSYNTHESIS AND CAD  COMPARISON:  05/08/2007 through 11/13/2012 mammograms  ACR Breast Density Category b: There are scattered areas of fibroglandular density.  FINDINGS: Digital bilateral diagnostic mammography demonstrates scattered fibroglandular densities. Post lumpectomy changes are again identified in the outer left breast. Spot-compression magnification view of the lumpectomy bed demonstrates expected postsurgical scarring and effacement of normal fibroglandular tissue. A metal tissue marker is noted along the anterior aspect of lumpectomy site consistent with denoting site of benign biopsy. There has been interval decrease in the previously seen postoperative seroma. No new or suspicious microcalcifications are identified.  Mammographic images were processed with CAD.  IMPRESSION: Post lumpectomy changes of the outer left breast without mammographic findings of new or recurrent malignancy.  RECOMMENDATION: Diagnostic mammogram is suggested in 1 year. (Code:[id number]-01Y)  I have discussed the findings and recommendations with the patient. Results were also provided in writing at the conclusion of the visit. If applicable, a reminder letter will be sent to the patient regarding the  next appointment.  BI-RADS CATEGORY  2: Benign.   Electronically Signed   By: Andres Shad   On: 08/15/2013 11:19    Impression/Plan:  I encouraged her to continue with yearly mammography and followup with me in a year. She would like to call later to schedule these appointments herself and declined having our schedulers participate in this today. I have  encouraged her to call if she has any issues or concerns until I see her in 12 months. _____________________________________   Eppie Gibson, MD

## 2013-08-19 ENCOUNTER — Telehealth: Payer: Self-pay | Admitting: *Deleted

## 2013-08-19 ENCOUNTER — Encounter (INDEPENDENT_AMBULATORY_CARE_PROVIDER_SITE_OTHER): Payer: Self-pay | Admitting: General Surgery

## 2013-08-19 ENCOUNTER — Ambulatory Visit (INDEPENDENT_AMBULATORY_CARE_PROVIDER_SITE_OTHER): Payer: 59 | Admitting: General Surgery

## 2013-08-19 VITALS — BP 126/80 | HR 72 | Temp 97.0°F | Ht 64.0 in | Wt 128.0 lb

## 2013-08-19 DIAGNOSIS — C50919 Malignant neoplasm of unspecified site of unspecified female breast: Secondary | ICD-10-CM

## 2013-08-19 DIAGNOSIS — C50912 Malignant neoplasm of unspecified site of left female breast: Secondary | ICD-10-CM

## 2013-08-19 NOTE — Telephone Encounter (Signed)
Pt returned my call and I confirmed 08/20/13 appt w/ pt.

## 2013-08-19 NOTE — Progress Notes (Signed)
HISTORY: Patient is a 58 year old female approximately 11 months after breast conservation therapy for left-sided DCIS (08/2013). This is performed by Dr. Margot Chimes. She did receive adjuvant radiation by Dr. Isidore Moos. She did not elect to take anti-hormone therapy. She did discuss this with oncology. She is seeing a holistic doctor and is taking iodine supplementation. She also avoids soy products.  She has occasional breast discomfort but nothing significant.   PERTINENT REVIEW OF SYSTEMS: O/w neg x 11  Filed Vitals:   08/19/13 1335  BP: 126/80  Pulse: 72  Temp: 97 F (36.1 C)   Wt Readings from Last 3 Encounters:  08/19/13 128 lb (58.06 kg)  01/14/13 134 lb (60.782 kg)  01/04/13 133 lb 3.2 oz (60.419 kg)    EXAM: Head: Normocephalic and atraumatic.  Eyes:  Conjunctivae are normal. Pupils are equal, round, and reactive to light. No scleral icterus.  Neck:  Normal range of motion. Neck supple. No tracheal deviation present. No thyromegaly present.  Resp: No respiratory distress, normal effort. Breast:  Bilateral breasts are dense, with fibrous corded tissue.  Small amount of hyperpigmentation on left.  Scar thickening on the left.  No axillary LAD.  No nipple retraction.   Abd:  Abdomen is soft, non distended and non tender. No masses are palpable.  There is no rebound and no guarding.  Neurological: Alert and oriented to person, place, and time. Coordination normal.  Skin: Skin is warm and dry. No rash noted. No diaphoretic. No erythema. No pallor.  Psychiatric: Normal mood and affect. Normal behavior. Judgment and thought content normal.   Mammogram 08/15/2013 IMPRESSION:  Post lumpectomy changes of the outer left breast without  mammographic findings of new or recurrent malignancy.     ASSESSMENT AND PLAN:   Breast cancer, left breast, DCIS Patient has good cosmetic appearance after lumpectomy.  She is up-to-date with her mammograms and colonoscopy.  She declined hormonal  chemoprevention, and is instead doing holistic medicine.  Her primary concern was the possibility of other cancers.    I will see her back in 6 months.        Milus Height, MD Surgical Oncology, [facility]  Lilian Coma, MD Lilian Coma, MD

## 2013-08-19 NOTE — Assessment & Plan Note (Signed)
Patient has good cosmetic appearance after lumpectomy.  She is up-to-date with her mammograms and colonoscopy.  She declined hormonal chemoprevention, and is instead doing holistic medicine.  Her primary concern was the possibility of other cancers.    I will see her back in 6 months.

## 2013-08-19 NOTE — Telephone Encounter (Signed)
Left message for pt to return my call so I can reschedule her appt.

## 2013-08-19 NOTE — Patient Instructions (Signed)
Follow up in 6 months with me.

## 2013-08-20 ENCOUNTER — Ambulatory Visit (HOSPITAL_BASED_OUTPATIENT_CLINIC_OR_DEPARTMENT_OTHER): Payer: 59 | Admitting: Hematology and Oncology

## 2013-08-20 ENCOUNTER — Other Ambulatory Visit: Payer: Self-pay | Admitting: Radiation Oncology

## 2013-08-20 ENCOUNTER — Telehealth: Payer: Self-pay | Admitting: *Deleted

## 2013-08-20 ENCOUNTER — Ambulatory Visit (HOSPITAL_BASED_OUTPATIENT_CLINIC_OR_DEPARTMENT_OTHER): Payer: 59

## 2013-08-20 VITALS — BP 148/87 | HR 67 | Temp 98.4°F | Resp 20 | Ht 64.0 in | Wt 128.8 lb

## 2013-08-20 DIAGNOSIS — Z853 Personal history of malignant neoplasm of breast: Secondary | ICD-10-CM

## 2013-08-20 DIAGNOSIS — C50912 Malignant neoplasm of unspecified site of left female breast: Secondary | ICD-10-CM

## 2013-08-20 DIAGNOSIS — C50512 Malignant neoplasm of lower-outer quadrant of left female breast: Secondary | ICD-10-CM

## 2013-08-20 LAB — COMPREHENSIVE METABOLIC PANEL
ALBUMIN: 4.4 g/dL (ref 3.5–5.2)
ALK PHOS: 72 U/L (ref 39–117)
ALT: 60 U/L — AB (ref 0–35)
AST: 30 U/L (ref 0–37)
BUN: 15 mg/dL (ref 6–23)
CALCIUM: 9.3 mg/dL (ref 8.4–10.5)
CHLORIDE: 102 meq/L (ref 96–112)
CO2: 25 mEq/L (ref 19–32)
Creatinine, Ser: 0.77 mg/dL (ref 0.50–1.10)
Glucose, Bld: 83 mg/dL (ref 70–99)
POTASSIUM: 4.8 meq/L (ref 3.5–5.3)
SODIUM: 139 meq/L (ref 135–145)
Total Bilirubin: 0.4 mg/dL (ref 0.2–1.2)
Total Protein: 8 g/dL (ref 6.0–8.3)

## 2013-08-20 LAB — CBC WITH DIFFERENTIAL/PLATELET
BASO%: 0.5 % (ref 0.0–2.0)
Basophils Absolute: 0 10*3/uL (ref 0.0–0.1)
EOS%: 2.2 % (ref 0.0–7.0)
Eosinophils Absolute: 0.1 10*3/uL (ref 0.0–0.5)
HCT: 39 % (ref 34.8–46.6)
HEMOGLOBIN: 12.5 g/dL (ref 11.6–15.9)
LYMPH#: 1.1 10*3/uL (ref 0.9–3.3)
LYMPH%: 29.3 % (ref 14.0–49.7)
MCH: 30.3 pg (ref 25.1–34.0)
MCHC: 32.1 g/dL (ref 31.5–36.0)
MCV: 94.4 fL (ref 79.5–101.0)
MONO#: 0.7 10*3/uL (ref 0.1–0.9)
MONO%: 17.9 % — AB (ref 0.0–14.0)
NEUT#: 2 10*3/uL (ref 1.5–6.5)
NEUT%: 50.1 % (ref 38.4–76.8)
Platelets: 206 10*3/uL (ref 145–400)
RBC: 4.13 10*6/uL (ref 3.70–5.45)
RDW: 12.8 % (ref 11.2–14.5)
WBC: 3.9 10*3/uL (ref 3.9–10.3)

## 2013-08-20 NOTE — Telephone Encounter (Signed)
Pt called in question if the 3pm appt for today was still opened.  Informed her yes and moved her appt.  Made Dr. Raliegh Ip aware, nurse aware and NT aware.

## 2013-08-20 NOTE — Progress Notes (Signed)
Karen Lawrence [id number] [birth year] 58 y.o. 08/20/2013 3:37 PM  CC  Lilian Coma, MD [place] [id number] Dr. Selmer Dominion  Dr. Eppie Gibson  Chief complaint: Follow up visit for DCIS  Diagnosis: DCIS of the left breast  STAGE:   No matching staging information was found for the patient.  HISTORY OF PRESENT ILLNESS: From original intake note:  Karen Lawrence is a 58 y.o. female.  Who presented 4 AM you'll mammogram in June 2014. This revealed a 11 cm area of suspicious calcifications in the left breast. Biopsy performed on June 10 Beal DCIS that was ER +100% PR +70%. MRI of the breasts in the left breast revealed in the anterior third of the lateral breast postbiopsy changes and signal void artifact. Patchy non-masslike enhancement throughout the lower outer quadrant of the left breast measuring 13.1 cm in anterior-posterior transverse and longitudinal dimensions. No adenopathy. Patient had additional biopsies on 08/30/2012 which again revealed DCIS that was ER positive.  Patient underwent a large lumpectomy on 09/11/2012. Final pathology did reveala 0.5 cm DCIS intermediate grade. Tumor was ER +100% PR +51%. Final staging Tis NX. Postoperatively she is doing well   Interval history:  Karen Lawrence is doing very well clinically she feels much better. She declined antiestrogen therapy. She completed her radiation therapy in October 2014. She follows up with Dr. Dr. Sharol Roussel for holistic therapy. Her recent mammogram which was done in June 2015 revealed no evidence of any malignancy.  She denies any shortness of breath, chest pain, palpitation, fever,  cough, headaches or blurred vision, blood in the stool or blood in the urine. she is maintaining her weight, she is eating well she does keep up with the exercise. Denies any tingling or numbness in the body.  Review of systems: A detailed 10 point review of systems is been assessed and pertinent symptoms as  mentioned in interval history   Past Medical History: Past Medical History  Diagnosis Date  . Breast cancer 07/2012    DCIS- Left Breast  . Hypertension   . Anxiety   . History of radiation therapy 11/28/2012-12/25/2012    50.05 Gy to left breast    Past Surgical History: Past Surgical History  Procedure Laterality Date  . Tubal ligation    . Wisdom tooth extraction  2004  . Breast lumpectomy with needle localization Left 09/11/2012    Procedure: BREAST LUMPECTOMY WITH NEEDLE LOCALIZATION removal of left breast cancer ;  Surgeon: Haywood Lasso, MD;  Location: [facility] OR;  Service: General;  Laterality: Left;    Family History: Family History  Problem Relation Age of Onset  . Breast cancer Mother 65  . Cancer Father   . Diabetes Father   . Breast cancer Sister 72  . Breast cancer Sister 44  . Breast cancer Sister 42  . Diabetes Sister   . Prostate cancer Brother 101  . HIV/AIDS Brother   . Uterine cancer Paternal Aunt     dx in her mid 30s    Social History History  Substance Use Topics  . Smoking status: Never Smoker   . Smokeless tobacco: Never Used  . Alcohol Use: No    Allergies: No Known Allergies  Current Medications: Current Outpatient Prescriptions  Medication Sig Dispense Refill  . cholecalciferol (VITAMIN D) 1000 UNITS tablet Take 1,000 Units by mouth daily.      . IODINE, KELP, PO Take 1 capsule by mouth daily.       Marland Kitchen  Multiple Vitamin (MULTIVITAMIN WITH MINERALS) TABS Take 1 tablet by mouth daily.      . non-metallic deodorant Jethro Poling) MISC Apply 1 application topically daily.      Marland Kitchen triamterene-hydrochlorothiazide (MAXZIDE-25) 37.5-25 MG per tablet Take 1 tablet by mouth daily. Pt taking 1/2 tablet       No current facility-administered medications for this visit.    OB/GYN History:  Fertility Discussion: n/a Prior History of Cancer: no  Health Maintenance:  Colonoscopy  Bone Density Last PAP smear  ECOG PERFORMANCE STATUS: 0 -  Asymptomatic  Genetic Counseling/testing: no   PHYSICAL EXAMINATION: Blood pressure 148/87, pulse 67, temperature 98.4 F (36.9 C), temperature source Oral, resp. rate 20, height $RemoveBe'5\' 4"'DRMOVDjJN$  (1.626 m), weight 128 lb 12.8 oz (58.423 kg).  BMW:UXLKG, healthy, no distress, well nourished and well developed SKIN: skin color, texture, turgor are normal HEAD: Normocephalic EYES: PERRLA, EOMI EARS: External ears normal OROPHARYNX:no exudate and no erythema  NECK: supple, no adenopathy LYMPH:  no palpable lymphadenopathy BREAST:surgical scars noted in the lef breast. No masses felt in both the breasts. No bilateral axillary lymphadenopathy noted LUNGS: clear to auscultation  HEART: regular rate & rhythm ABDOMEN:abdomen soft, non-tender, normal bowel sounds and no masses or organomegaly BACK: Back symmetric, no curvature. EXTREMITIES:no edema, no clubbing, no cyanosis  NEURO: alert & oriented x 3 with fluent speech, no focal motor/sensory deficits  STUDIES/RESULTS: Chest 2 View  09/10/2012   *RADIOLOGY REPORT*  Clinical Data: Left-sided breast cancer.  CHEST - 2 VIEW  Comparison: None.  Findings: Heart size is normal.  The lungs are clear.  The visualized soft tissues and bony thorax are unremarkable.  IMPRESSION: Negative two-view chest radiograph.   Original Report Authenticated By: San Morelle, M.D.   Mm Lt Plc Breast Loc Dev   1st Lesion  Inc Mammo Guide  09/11/2012   *RADIOLOGY REPORT*  Clinical Data: Patient with left breast DCIS  NEEDLE LOCALIZATION WITH MAMMOGRAPHIC GUIDANCE AND SPECIMEN RADIOGRAPH  Comparison:  [facility] including 08/30/2012  Patient presents for needle localization prior to lumpectomy. I met with the patient and we discussed the procedure of needle localization including benefits and alternatives. We discussed the high likelihood of a successful procedure. We discussed the risks of the procedure, including infection, bleeding, tissue injury, and further surgery.  Informed, written consent was given. The usual time-out protocol was performed immediately prior to the procedure.  Using mammographic guidance, sterile technique, 2% lidocaine and a 7 cm modified Kopans needle the posterior extent of the calcifications and posterior biopsy marking clip within the lateral left breast were localized using a lateral approach.  Additionally the anterior extent of the calcifications and anterior biopsy marking clip were localized using lateral approach and a 5 cm Kopans wire.  The films are marked for Dr. Margot Chimes. Specimen radiograph was performed at [facility] and confirms the anterior and posterior biopsy marking clips as well as the posterior Kopans wire to be present in the tissue sample.  The specimen is marked for pathology.  IMPRESSION: Needle localization of calcifications and biopsy marking clips in the lateral left breast.  No apparent complications.   Original Report Authenticated By: Lovey Newcomer, M.D     LABS:    Chemistry      Component Value Date/Time   NA 141 10/01/2012 1245   NA 136 09/10/2012 1236   K 4.1 10/01/2012 1245   K 3.6 09/10/2012 1236   CL 101 09/10/2012 1236   CO2 25 10/01/2012 1245   CO2  25 09/10/2012 1236   BUN 13.1 10/01/2012 1245   BUN 18 09/10/2012 1236   CREATININE 0.9 10/01/2012 1245   CREATININE 0.94 09/10/2012 1236      Component Value Date/Time   CALCIUM 9.6 10/01/2012 1245   CALCIUM 9.7 09/10/2012 1236   ALKPHOS 59 10/01/2012 1245   ALKPHOS 59 09/10/2012 1236   AST 34 10/01/2012 1245   AST 30 09/10/2012 1236   ALT 32 10/01/2012 1245   ALT 26 09/10/2012 1236   BILITOT 0.50 10/01/2012 1245   BILITOT 0.4 09/10/2012 1236      Lab Results  Component Value Date   WBC 4.7 10/01/2012   HGB 11.8 10/01/2012   HCT 36.1 10/01/2012   MCV 91.0 10/01/2012   PLT 205 10/01/2012     ASSESSMENT/PLAN:    #1 DCIS measuring at least 8.5 cm ER positive PR positive. Patient is status post generous lumpectomy: I have reviewed her CBC and CMP today and CMP revealed an  elevation in ALT. We'll repeat the liver functions in 4-6 weeks. Her mammogram performed in June 2015 revealed no evidence of any malignancy. Her next mammogram due in June 2016.   #2 completed  adjuvant radiation therapy10/02/2012-12/25/2012   she will be seeing Dr. Eppie Gibson.  #3  She declined antiestrogen therapy  #4 She is on holistic treatment with Dr. Sharol Roussel  #5 and right breast the patient to continue healthy diet and good exercise and encouraged her to perform monthly self breast exam  Next followup visit in 6 months with CBC and differential and CMP on the day of next visit   All questions were answered. The patient knows to call the clinic with any problems, questions or concerns. We can certainly see the patient much sooner if necessary.  I spent 20 minutes counseling the patient face to face. The total time spent in the appointment was 30 minutes.  Wilmon Arms, MD Medical/Oncology [facility] [phone] (Office)  08/20/2013, 3:37 PM

## 2013-08-21 ENCOUNTER — Telehealth: Payer: Self-pay | Admitting: Hematology and Oncology

## 2013-08-22 ENCOUNTER — Telehealth: Payer: Self-pay | Admitting: Oncology

## 2013-08-22 ENCOUNTER — Telehealth: Payer: Self-pay | Admitting: *Deleted

## 2013-08-22 DIAGNOSIS — C50519 Malignant neoplasm of lower-outer quadrant of unspecified female breast: Secondary | ICD-10-CM

## 2013-08-22 DIAGNOSIS — C50912 Malignant neoplasm of unspecified site of left female breast: Secondary | ICD-10-CM

## 2013-08-22 NOTE — Telephone Encounter (Signed)
S/w the pt and she stated that she is out of town and she will call back on Monday to set up her lab appt

## 2013-08-22 NOTE — Telephone Encounter (Signed)
, °

## 2013-08-22 NOTE — Telephone Encounter (Signed)
Per Dr. Earnest Conroy, please inform patient her LFT's are elevated. Need to recheck LFT in 4-6 weeks. Patient verbalized understanding of lab results and why they need to be rechecked. Patient denied drinking alcohol. Patient was driving during this conversation and will call scheduling to make the lab appointment. POF sent to scheduling.

## 2013-08-23 ENCOUNTER — Ambulatory Visit: Payer: 59 | Admitting: Oncology

## 2013-08-26 ENCOUNTER — Telehealth: Payer: Self-pay | Admitting: Hematology and Oncology

## 2013-08-26 NOTE — Telephone Encounter (Signed)
, °

## 2013-09-09 ENCOUNTER — Other Ambulatory Visit: Payer: Self-pay

## 2013-09-09 DIAGNOSIS — C50912 Malignant neoplasm of unspecified site of left female breast: Secondary | ICD-10-CM

## 2013-09-10 ENCOUNTER — Other Ambulatory Visit (HOSPITAL_BASED_OUTPATIENT_CLINIC_OR_DEPARTMENT_OTHER): Payer: 59

## 2013-09-10 DIAGNOSIS — Z853 Personal history of malignant neoplasm of breast: Secondary | ICD-10-CM

## 2013-09-10 DIAGNOSIS — C50912 Malignant neoplasm of unspecified site of left female breast: Secondary | ICD-10-CM

## 2013-09-10 LAB — CBC WITH DIFFERENTIAL/PLATELET
BASO%: 0.7 % (ref 0.0–2.0)
Basophils Absolute: 0 10*3/uL (ref 0.0–0.1)
EOS%: 1.8 % (ref 0.0–7.0)
Eosinophils Absolute: 0.1 10*3/uL (ref 0.0–0.5)
HEMATOCRIT: 36.8 % (ref 34.8–46.6)
HGB: 11.8 g/dL (ref 11.6–15.9)
LYMPH%: 25.2 % (ref 14.0–49.7)
MCH: 30.3 pg (ref 25.1–34.0)
MCHC: 32.1 g/dL (ref 31.5–36.0)
MCV: 94.4 fL (ref 79.5–101.0)
MONO#: 0.7 10*3/uL (ref 0.1–0.9)
MONO%: 13.7 % (ref 0.0–14.0)
NEUT#: 2.9 10*3/uL (ref 1.5–6.5)
NEUT%: 58.6 % (ref 38.4–76.8)
Platelets: 197 10*3/uL (ref 145–400)
RBC: 3.9 10*6/uL (ref 3.70–5.45)
RDW: 12.5 % (ref 11.2–14.5)
WBC: 4.9 10*3/uL (ref 3.9–10.3)
lymph#: 1.2 10*3/uL (ref 0.9–3.3)

## 2013-09-10 LAB — COMPREHENSIVE METABOLIC PANEL (CC13)
ALK PHOS: 61 U/L (ref 40–150)
ALT: 52 U/L (ref 0–55)
AST: 28 U/L (ref 5–34)
Albumin: 4.1 g/dL (ref 3.5–5.0)
Anion Gap: 7 mEq/L (ref 3–11)
BILIRUBIN TOTAL: 0.36 mg/dL (ref 0.20–1.20)
BUN: 14.4 mg/dL (ref 7.0–26.0)
CALCIUM: 9.7 mg/dL (ref 8.4–10.4)
CO2: 28 mEq/L (ref 22–29)
Chloride: 106 mEq/L (ref 98–109)
Creatinine: 0.9 mg/dL (ref 0.6–1.1)
Glucose: 99 mg/dl (ref 70–140)
Potassium: 4.3 mEq/L (ref 3.5–5.1)
Sodium: 141 mEq/L (ref 136–145)
Total Protein: 7.6 g/dL (ref 6.4–8.3)

## 2013-09-12 ENCOUNTER — Telehealth: Payer: Self-pay

## 2013-09-12 NOTE — Telephone Encounter (Signed)
LMOVM - labs normal.  Printing and mailing.  Pt to call clinic if she has any questions.

## 2013-09-13 ENCOUNTER — Telehealth: Payer: Self-pay | Admitting: *Deleted

## 2013-09-13 NOTE — Telephone Encounter (Signed)
Received call from pt. Although she received a call on VM stating labs were within normal limits, pt wanted to know specifics. I discussed with pt in detail her lab values. Pt was pleased with lab results and verbalized understanding. She also thanked [place] for taking time to review these with her.

## 2013-09-20 ENCOUNTER — Other Ambulatory Visit: Payer: 59

## 2014-01-31 ENCOUNTER — Ambulatory Visit: Payer: 59 | Admitting: Radiation Oncology

## 2014-02-05 ENCOUNTER — Telehealth: Payer: Self-pay | Admitting: Hematology and Oncology

## 2014-02-05 NOTE — Telephone Encounter (Signed)
pt called to sched appt....i asked pt for name and she insisted that i was not listening. I explained that i could not answer her questions if i was not able to look at her information.... I successfully sched pt for appt....pt ok and d.t.

## 2014-03-12 ENCOUNTER — Telehealth: Payer: Self-pay | Admitting: Hematology and Oncology

## 2014-03-12 NOTE — Telephone Encounter (Signed)
pt cld to CX appt-stated she will r/s @ a later date

## 2014-03-14 ENCOUNTER — Ambulatory Visit: Payer: 59 | Admitting: Hematology and Oncology

## 2014-05-13 ENCOUNTER — Other Ambulatory Visit: Payer: Self-pay | Admitting: Family Medicine

## 2014-05-13 ENCOUNTER — Other Ambulatory Visit (HOSPITAL_COMMUNITY)
Admission: RE | Admit: 2014-05-13 | Discharge: 2014-05-13 | Disposition: A | Discharge status: Home / Self Care | Payer: 59 | Source: Ambulatory Visit | Attending: Family Medicine | Admitting: Family Medicine

## 2014-05-13 DIAGNOSIS — Z124 Encounter for screening for malignant neoplasm of cervix: Secondary | ICD-10-CM | POA: Diagnosis present

## 2014-05-14 LAB — CYTOLOGY - PAP

## 2014-07-22 ENCOUNTER — Telehealth: Payer: Self-pay | Admitting: Hematology and Oncology

## 2014-07-22 NOTE — Telephone Encounter (Signed)
Patient called and schedule appointment for June. Patient confirmed appointment.

## 2014-08-06 ENCOUNTER — Ambulatory Visit: Payer: 59 | Admitting: Radiation Oncology

## 2014-08-18 ENCOUNTER — Ambulatory Visit
Admission: RE | Admit: 2014-08-18 | Discharge: 2014-08-18 | Disposition: A | Discharge status: Home / Self Care | Payer: 59 | Source: Ambulatory Visit | Attending: Radiation Oncology | Admitting: Radiation Oncology

## 2014-08-18 DIAGNOSIS — Z853 Personal history of malignant neoplasm of breast: Secondary | ICD-10-CM

## 2014-08-20 ENCOUNTER — Ambulatory Visit: Payer: 59 | Admitting: Radiation Oncology

## 2014-08-20 ENCOUNTER — Ambulatory Visit: Payer: Self-pay | Admitting: Radiation Oncology

## 2014-08-27 ENCOUNTER — Ambulatory Visit
Admission: RE | Admit: 2014-08-27 | Discharge: 2014-08-27 | Disposition: A | Discharge status: Home / Self Care | Payer: 59 | Source: Ambulatory Visit | Attending: Radiation Oncology | Admitting: Radiation Oncology

## 2014-08-27 VITALS — BP 138/77 | HR 72 | Temp 98.4°F | Wt 136.0 lb

## 2014-08-27 DIAGNOSIS — C50512 Malignant neoplasm of lower-outer quadrant of left female breast: Secondary | ICD-10-CM

## 2014-08-27 NOTE — Progress Notes (Addendum)
Routine one month follow up post completion of radiation to left breast.Completed 20 fractions with total dose of 50.05 Gy.Denies pain or any other problems.Bilateral mammogram performed on 07/2014 was negative for recurrent malignancy. BP 138/77 mmHg  Pulse 72  Temp(Src) 98.4 F (36.9 C)  Wt 136 lb (61.689 kg)

## 2014-08-27 NOTE — Progress Notes (Signed)
Radiation Oncology         [phone] ________________________________  Name: ZEVA LEBER MRN: [id number]  Date: 08/27/2014  DOB: 22-Oct-1955  Follow-Up Visit Note  Outpatient  CC: Lilian Coma, MD  Neldon Mc, MD  Diagnosis and Prior Radiotherapy:   left breast, DCIS - ER100%, PR 7%  Indication for treatment: curative  Radiation treatment dates: 11/28/2012-12/25/2012  Site/dose: 1) Left Breast/ 40.05 Gy in 15 fractions  2) Left Breast Boost / 10 Gy in 5 fractions   Narrative:  The patient returns today for routine follow-up. Denies pain or any other problems. Bilateral mammogram performed on 07/2014 was negative for recurrent malignancy, [facility].  She is still not on anti-estrogen medication.                      ALLERGIES:  has No Known Allergies.  Meds: Current Outpatient Prescriptions  Medication Sig Dispense Refill  . cholecalciferol (VITAMIN D) 1000 UNITS tablet Take 1,000 Units by mouth daily.    . IODINE, KELP, PO Take 1 capsule by mouth daily.     . Multiple Vitamin (MULTIVITAMIN WITH MINERALS) TABS Take 1 tablet by mouth daily.    Marland Kitchen triamterene-hydrochlorothiazide (MAXZIDE-25) 37.5-25 MG per tablet Take 1 tablet by mouth daily. Pt taking 1/2 tablet    . non-metallic deodorant (ALRA) MISC Apply 1 application topically daily.     No current facility-administered medications for this encounter.    Physical Findings: The patient is in no acute distress. Patient is alert and oriented.  weight is 136 lb (61.689 kg). Her temperature is 98.4 F (36.9 C). Her blood pressure is 138/77 and her pulse is 72. .   General: Alert and oriented, in no acute distress HEENT: Head is normocephalic. Extraocular movements are intact.  Neck: Neck is supple, no palpable cervical or supraclavicular lymphadenopathy. Heart: Regular in rate and rhythm with no murmurs, rubs, or gallops. Chest: Clear to auscultation bilaterally, with no rhonchi, wheezes, or  rales. Abdomen: Soft, nontender, nondistended, with no rigidity or guarding. Extremities: No cyanosis or edema. Lymphatics: see Neck Exam Skin: Some residual hyperpigmentation over the left breast. Breast: Both breasts and axillary regions are without any suspicious palpable abnormalities. There is some palpable scar tissue in the lower outer quadrant of the left breast consistent with prior treatment.   Lab Findings: Lab Results  Component Value Date   WBC 4.9 09/10/2013   HGB 11.8 09/10/2013   HCT 36.8 09/10/2013   MCV 94.4 09/10/2013   PLT 197 09/10/2013    Radiographic Findings:  CLINICAL DATA: History of malignant lumpectomy of the left breast in 2014. Annual re-evaluation.  EXAM: DIGITAL DIAGNOSTIC BILATERAL MAMMOGRAM WITH 3D TOMOSYNTHESIS AND CAD  COMPARISON: Previous examinations the most recent which is dated 08/15/2013.  ACR Breast Density Category b: There are scattered areas of fibroglandular density.  FINDINGS: There are stable scarring changes located laterally within the left breast related to the patient's lumpectomy. There is no specific evidence for recurrent tumor or developing malignancy within either breast and the parenchymal pattern is stable.  Mammographic images were processed with CAD.  IMPRESSION: No findings worrisome for recurrent tumor or developing malignancy.  RECOMMENDATION: Bilateral diagnostic mammography in 1 year.  I have discussed the findings and recommendations with the patient. Results were also provided in writing at the conclusion of the visit. If applicable, a reminder letter will be sent to the patient regarding the next appointment.  BI-RADS CATEGORY 1: Negative.  Impression/Plan:  NED. Discussed the breast cancer survivorship program. She would prefer to continue to see me. We will call to schedule follow up visit in 1 year. She will coordinate for her next mammogram to be performed before our next  visit.   This document serves as a record of services personally performed by Eppie Gibson, MD. It was created on her behalf by Arlyce Harman, a trained medical scribe. The creation of this record is based on the scribe's personal observations and the provider's statements to them. This document has been checked and approved by the attending provider. _____________________________________   Eppie Gibson, MD

## 2014-08-27 NOTE — Assessment & Plan Note (Addendum)
DCIS measuring at least 8.5 cm ER positive PR positive. Patient is status post generous lumpectomy on 09/11/2012. Final pathology did reveal a 0.5 cm DCIS intermediate grade. Tumor was ER +100% PR +51%. Final staging Tis [id number] antiestrogen therapy. She completed her radiation therapy in October 2014  Breast Cancer Surveillance: 1. Breast exam 08/28/2014: Normal 2. Mammogram 08/18/2014 No abnormalities. Postsurgical changes. Breast Density Category B. I recommended that she get 3-D mammograms for surveillance.  Return to clinic in 1 year for follow-up Discussed the differences between different breast density categories.

## 2014-08-28 ENCOUNTER — Ambulatory Visit (HOSPITAL_BASED_OUTPATIENT_CLINIC_OR_DEPARTMENT_OTHER): Payer: 59 | Admitting: Hematology and Oncology

## 2014-08-28 ENCOUNTER — Telehealth: Payer: Self-pay | Admitting: *Deleted

## 2014-08-28 ENCOUNTER — Encounter: Payer: Self-pay | Admitting: Hematology and Oncology

## 2014-08-28 VITALS — BP 138/87 | HR 76 | Temp 98.0°F | Resp 18 | Ht 64.0 in | Wt 136.0 lb

## 2014-08-28 DIAGNOSIS — C50912 Malignant neoplasm of unspecified site of left female breast: Secondary | ICD-10-CM

## 2014-08-28 DIAGNOSIS — Z86000 Personal history of in-situ neoplasm of breast: Secondary | ICD-10-CM

## 2014-08-28 NOTE — Progress Notes (Signed)
Patient Care Team: Jonathon Jordan, MD as PCP - General (Family Medicine)  DIAGNOSIS: No matching staging information was found for the patient.  SUMMARY OF ONCOLOGIC HISTORY:   Breast cancer, left breast, DCIS   08/10/2012 Initial Diagnosis DCIS ER +100% PR +70%.    09/11/2012 Surgery Left Lumpectomy: 0.5 cm DCIS intermediate grade. Tumor was ER +100% PR +51%. 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 She does not have any problems or concerns. She feels great she exercises every day takes care of her health and eats healthy. Denies any lumps or nodules in the breasts.  REVIEW OF SYSTEMS:   Constitutional: Denies fevers, chills or abnormal weight loss Eyes: Denies blurriness of vision Ears, nose, mouth, throat, and face: Denies mucositis or sore throat Respiratory: Denies cough, dyspnea or wheezes Cardiovascular: Denies palpitation, chest discomfort or lower extremity swelling Gastrointestinal:  Denies nausea, heartburn or change in bowel habits Skin: Denies abnormal skin rashes Lymphatics: Denies new lymphadenopathy or easy bruising Neurological:Denies numbness, tingling or new weaknesses Behavioral/Psych: Mood is stable, no new changes  Breast:  denies any pain or lumps or nodules in either breasts All other systems were reviewed with the patient and are negative.  I have reviewed the past medical history, past surgical history, social history and family history with the patient and they are unchanged from previous note.  ALLERGIES:  has No Known Allergies.  MEDICATIONS:  Current Outpatient Prescriptions  Medication Sig Dispense Refill  . cholecalciferol (VITAMIN D)  1000 UNITS tablet Take 1,000 Units by mouth daily.    . IODINE, KELP, PO Take 1 capsule by mouth daily.     . Multiple Vitamin (MULTIVITAMIN WITH MINERALS) TABS Take 1 tablet by mouth daily.    . non-metallic deodorant Jethro Poling) MISC Apply 1 application topically daily.    Marland Kitchen triamterene-hydrochlorothiazide (MAXZIDE-25) 37.5-25 MG per tablet Take 1 tablet by mouth daily. Pt taking 1/2 tablet     No current facility-administered medications for this visit.    PHYSICAL EXAMINATION: ECOG PERFORMANCE STATUS: 0 - Asymptomatic  Filed Vitals:   08/28/14 1020  BP: 138/87  Pulse: 76  Temp: 98 F (36.7 C)  Resp: 18   Filed Weights   08/28/14 1020  Weight: 136 lb (61.689 kg)    GENERAL:alert, no distress and comfortable SKIN: skin color, texture, turgor are normal, no rashes or significant lesions EYES: normal, Conjunctiva are pink and non-injected, sclera clear OROPHARYNX:no exudate, no erythema and lips, buccal mucosa, and tongue normal  NECK: supple, thyroid normal size, non-tender, without nodularity LYMPH:  no palpable lymphadenopathy in the cervical, axillary or inguinal LUNGS: clear to auscultation and percussion with normal breathing effort HEART: regular rate & rhythm and no murmurs and no lower extremity edema ABDOMEN:abdomen soft, non-tender and normal bowel sounds Musculoskeletal:no cyanosis of digits and no clubbing  NEURO: alert & oriented x 3 with fluent speech, no focal motor/sensory deficits   LABORATORY DATA:  I have reviewed the data as listed   Chemistry      Component Value Date/Time   NA 141 09/10/2013 1401   NA 139 08/20/2013 1540   K 4.3 09/10/2013 1401   K 4.8 08/20/2013 1540  CL 102 08/20/2013 1540   CO2 28 09/10/2013 1401   CO2 25 08/20/2013 1540   BUN 14.4 09/10/2013 1401   BUN 15 08/20/2013 1540   CREATININE 0.9 09/10/2013 1401   CREATININE 0.77 08/20/2013 1540      Component Value Date/Time   CALCIUM 9.7 09/10/2013 1401   CALCIUM 9.3 08/20/2013  1540   ALKPHOS 61 09/10/2013 1401   ALKPHOS 72 08/20/2013 1540   AST 28 09/10/2013 1401   AST 30 08/20/2013 1540   ALT 52 09/10/2013 1401   ALT 60* 08/20/2013 1540   BILITOT 0.36 09/10/2013 1401   BILITOT 0.4 08/20/2013 1540       Lab Results  Component Value Date   WBC 4.9 09/10/2013   HGB 11.8 09/10/2013   HCT 36.8 09/10/2013   MCV 94.4 09/10/2013   PLT 197 09/10/2013   NEUTROABS 2.9 09/10/2013     RADIOGRAPHIC STUDIES: I have personally reviewed the radiology reports and agreed with their findings. Mammograms 08/18/2014 no abnormalities  ASSESSMENT & PLAN:  Breast cancer, left breast, DCIS DCIS measuring at least 8.5 cm ER positive PR positive. Patient is status post generous lumpectomy on 09/11/2012. Final pathology did reveal a 0.5 cm DCIS intermediate grade. Tumor was ER +100% PR +51%. Final staging Tis AJ:[id number] antiestrogen therapy. She completed her radiation therapy in October 2014  Breast Cancer Surveillance: 1. Breast exam 08/27/2014 (Dr.Squire): Normal 2. Mammogram 08/18/2014 No abnormalities. Postsurgical changes. Breast Density Category B. I recommended that she get 3-D mammograms for surveillance.  Return to clinic in 1 year for follow-up      No orders of the defined types were placed in this encounter.   The patient has a good understanding of the overall plan. she agrees with it. she will call with any problems that may develop before the next visit here.   Rulon Eisenmenger, MD

## 2014-08-28 NOTE — Telephone Encounter (Signed)
CALLED PATIENT TO INFORM OF FU FOR 08/26/2015 @ 3:30 PM WITH DR. Isidore Moos SPOKE WITH PATIENT AND SHE IS AWARE OF THIS APPT.

## 2014-10-06 ENCOUNTER — Other Ambulatory Visit: Payer: Self-pay | Admitting: Radiation Oncology

## 2014-10-06 DIAGNOSIS — Z853 Personal history of malignant neoplasm of breast: Secondary | ICD-10-CM

## 2015-02-20 IMAGING — MG MM DIAGNOSTIC UNILATERAL L
8 series · 8 of 16 positions shown · non-contrast
Comparison: Mammography 09/11/2012, 08/07/2012, dating back to
03/31/2005.

CLINICAL DATA: Malignant left breast lumpectomy for DCIS in August 2012.  Evaluate for residual calcifications prior to radiation
therapy.

DIGITAL DIAGNOSTIC LEFT MAMMOGRAM
DIGITAL BREAST TOMOSYNTHESIS
Digital breast tomosynthesis images are acquired in two
projections.  These images are reviewed in combination with the
digital mammogram, confirming the findings below.

[L CC]
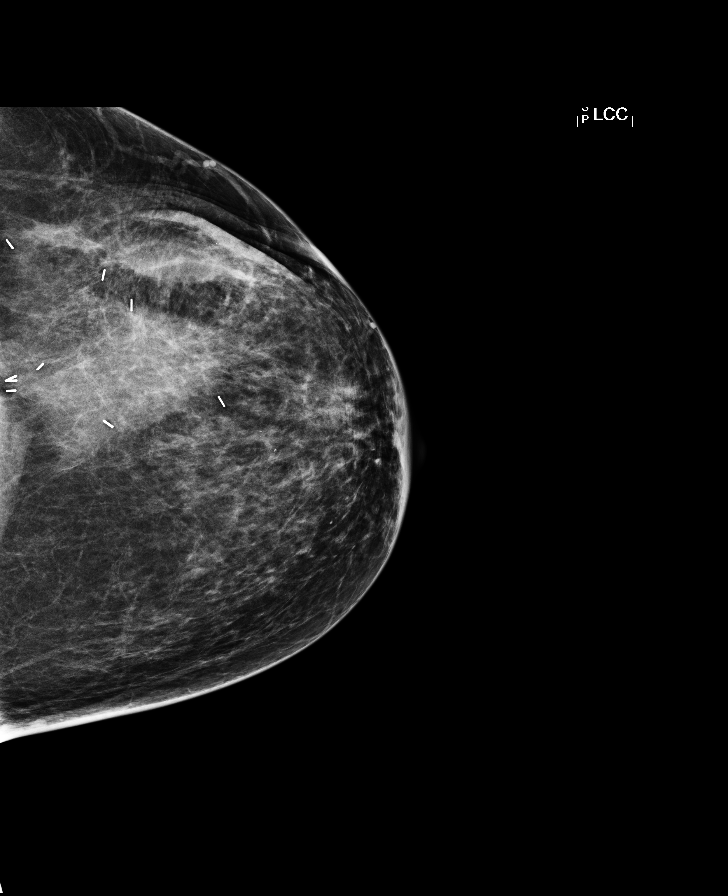

[L MLO]
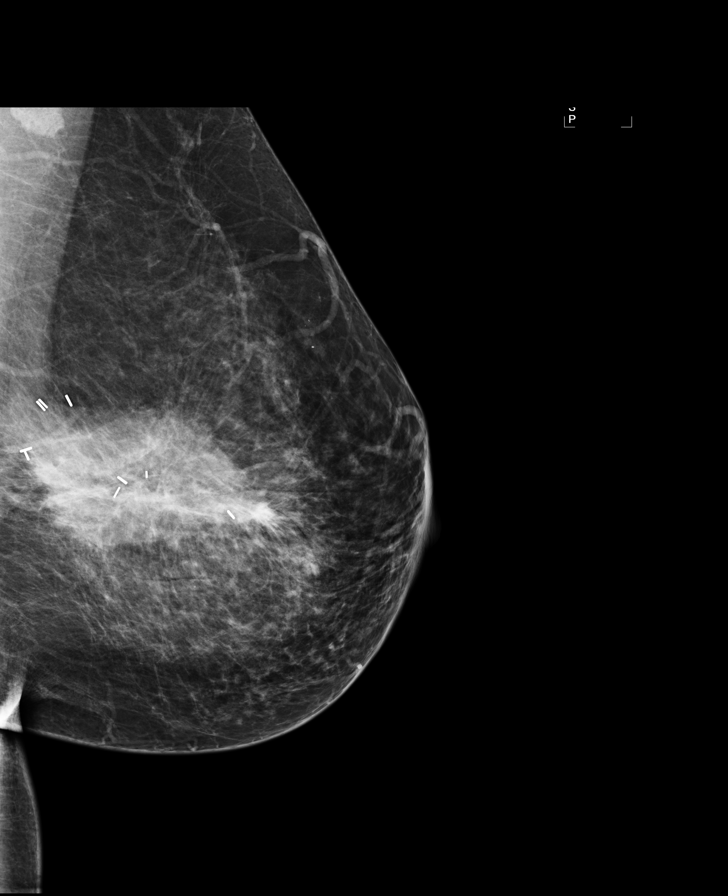

[L TAN]
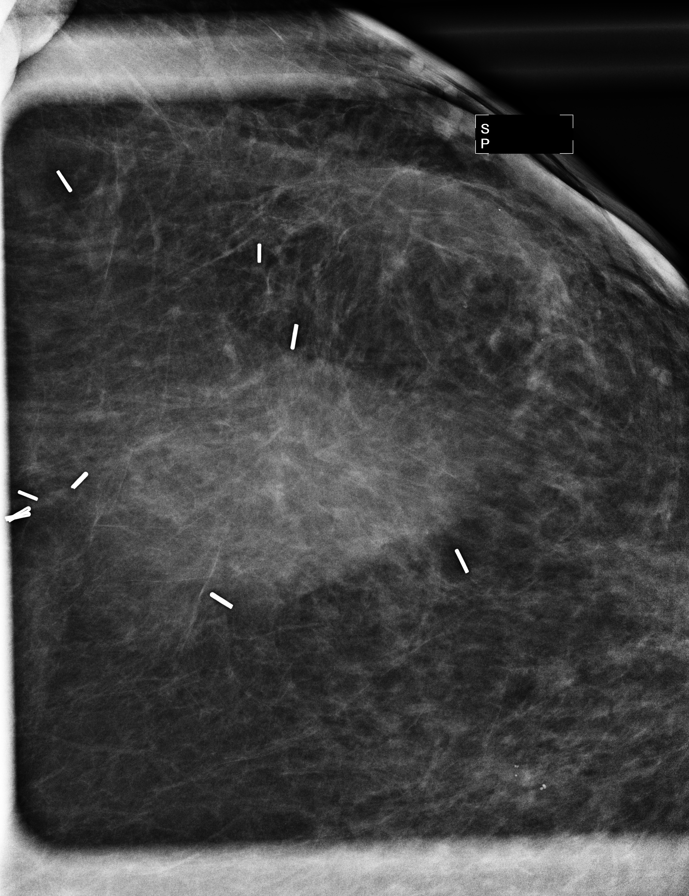

[L ML]
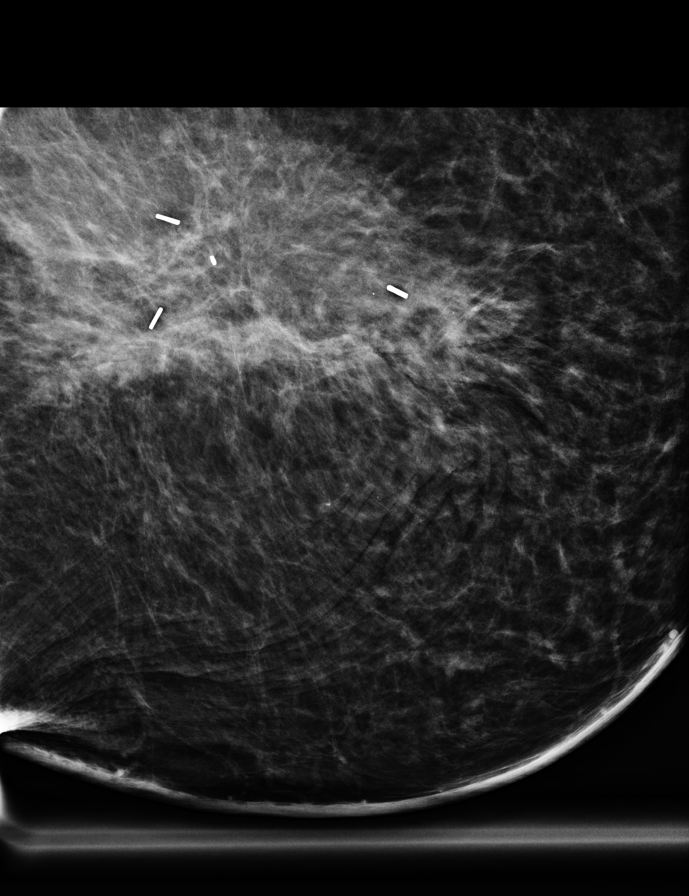

[L MLO tomo · tomo slice 37/72.0]
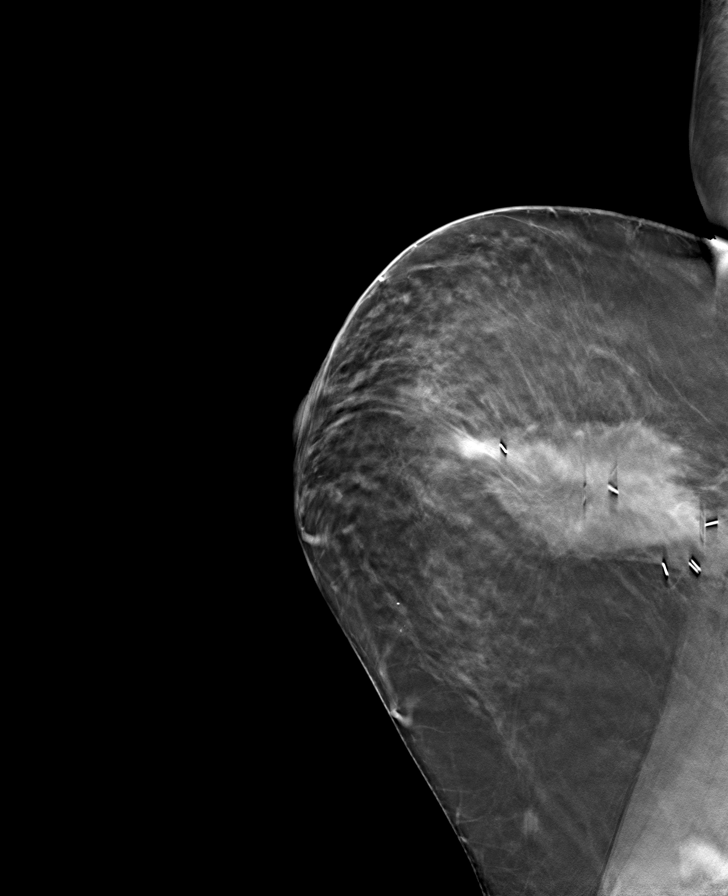

[L CC tomo · tomo slice 40/79.0]
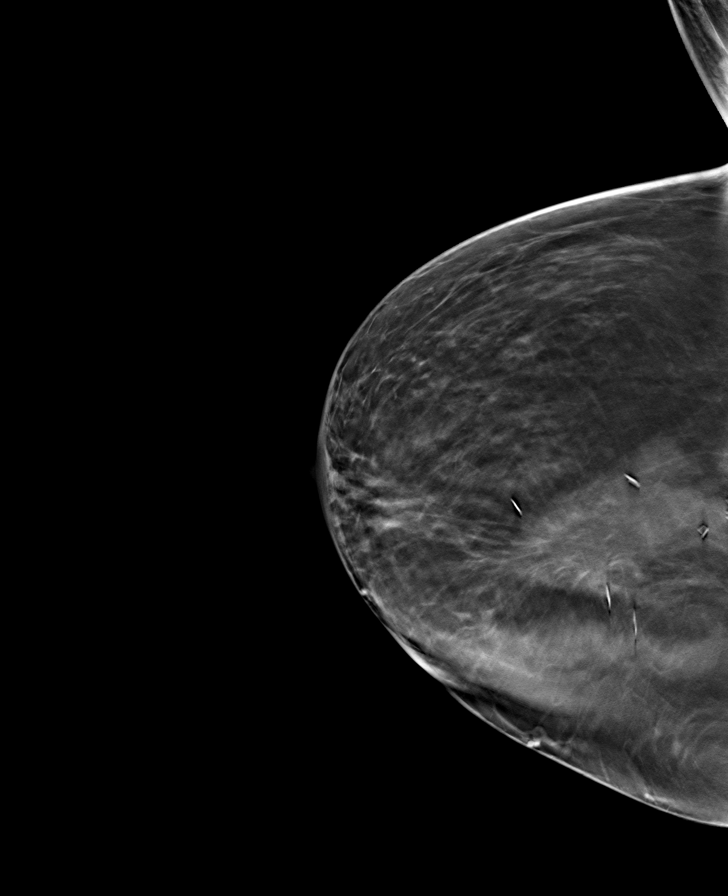

[L CC synth-2D]
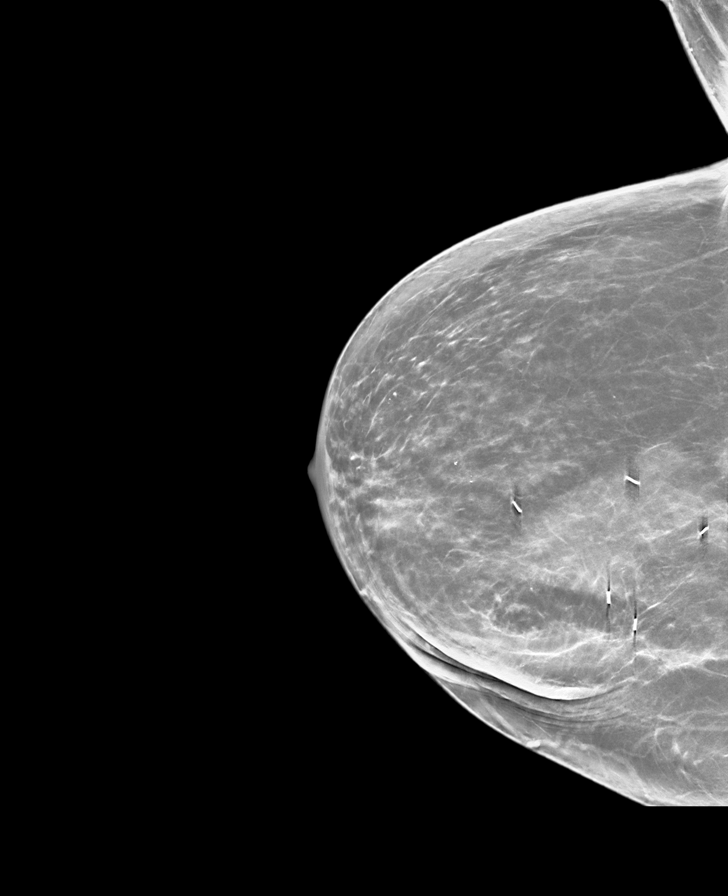

[L MLO synth-2D]
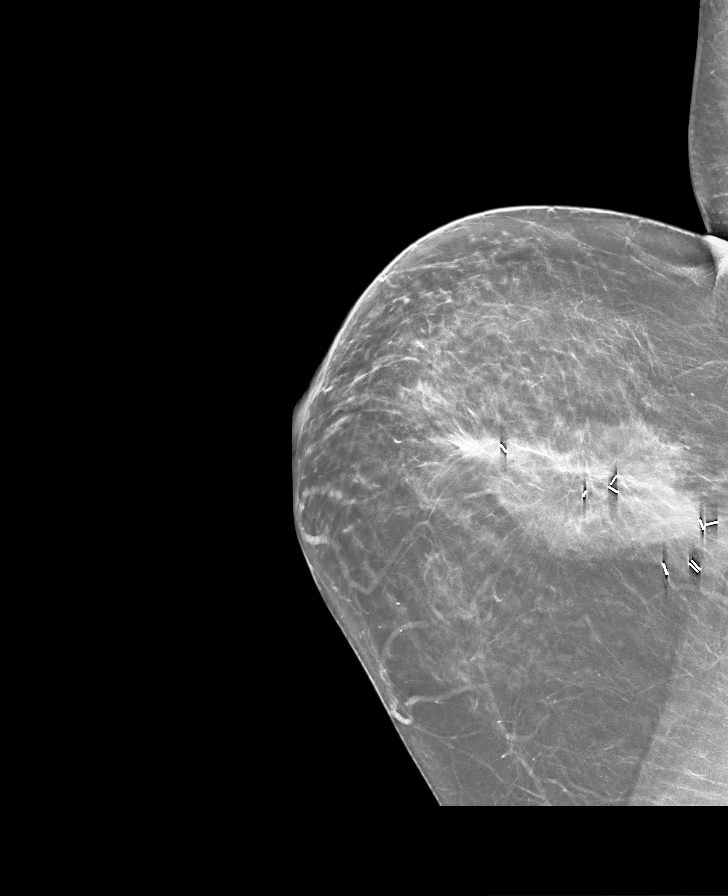

[8 of 16 positions shown; findings below may reference images not displayed]

FINDINGS: ACR Breast Density Category b:  There are scattered areas of
fibroglandular density.

CC and MLO views of both breasts, a spot tangential view of the
lumpectomy site in the CC projection, and a spot magnification true
lateral view of calcifications in the anterior third of the left
breast were obtained.  There are scattered predominately punctate
calcifications at the anterior margin of the lumpectomy scar, faint
on the CC view is better seen on the MLO and true lateral spot
views.  Expected post lumpectomy changes are present in the outer
left breast.

Mammographic images were processed with CAD.
IMPRESSION: Scattered punctate calcifications at the anterior margin of the
lumpectomy scar, technically indeterminate as the prior DCIS
calcifications in the outer left breast were also predominantly
punctate.

RECOMMENDATION:
Stereotactic biopsy of these calcifications may be considered if
reexcision is contemplated, become is it is certainly possible that
these are benign calcifications and related to fibrocystic changes.
If the patient is to undergo radiation therapy, then biopsy may not
be necessary.

I have discussed the findings and recommendations with the patient.
Results were also provided in writing at the conclusion of the
visit.  If applicable, a reminder letter will be sent to the
patient regarding her next appointment.

BI-RADS CATEGORY 4:  Suspicious abnormality - biopsy should be
considered.

Please see above discussion.

## 2015-06-01 DIAGNOSIS — Z79899 Other long term (current) drug therapy: Secondary | ICD-10-CM | POA: Diagnosis not present

## 2015-06-01 DIAGNOSIS — I10 Essential (primary) hypertension: Secondary | ICD-10-CM | POA: Diagnosis not present

## 2015-06-01 DIAGNOSIS — E559 Vitamin D deficiency, unspecified: Secondary | ICD-10-CM | POA: Diagnosis not present

## 2015-06-01 DIAGNOSIS — Z0000 Encounter for general adult medical examination without abnormal findings: Secondary | ICD-10-CM | POA: Diagnosis not present

## 2015-07-02 DIAGNOSIS — S335XXA Sprain of ligaments of lumbar spine, initial encounter: Secondary | ICD-10-CM | POA: Diagnosis not present

## 2015-07-31 DIAGNOSIS — M940 Chondrocostal junction syndrome [Tietze]: Secondary | ICD-10-CM | POA: Diagnosis not present

## 2015-08-19 ENCOUNTER — Ambulatory Visit
Admission: RE | Admit: 2015-08-19 | Discharge: 2015-08-19 | Disposition: A | Discharge status: Home / Self Care | Payer: BLUE CROSS/BLUE SHIELD | Source: Ambulatory Visit | Attending: Radiation Oncology | Admitting: Radiation Oncology

## 2015-08-19 DIAGNOSIS — Z853 Personal history of malignant neoplasm of breast: Secondary | ICD-10-CM

## 2015-08-19 DIAGNOSIS — R928 Other abnormal and inconclusive findings on diagnostic imaging of breast: Secondary | ICD-10-CM | POA: Diagnosis not present

## 2015-08-26 ENCOUNTER — Ambulatory Visit
Admission: RE | Admit: 2015-08-26 | Discharge: 2015-08-26 | Disposition: A | Discharge status: Home / Self Care | Payer: BLUE CROSS/BLUE SHIELD | Source: Ambulatory Visit | Attending: Radiation Oncology | Admitting: Radiation Oncology

## 2015-08-26 ENCOUNTER — Encounter: Payer: Self-pay | Admitting: Radiation Oncology

## 2015-08-26 VITALS — BP 137/73 | HR 64 | Temp 97.5°F | Ht 64.0 in | Wt 139.1 lb

## 2015-08-26 DIAGNOSIS — C50512 Malignant neoplasm of lower-outer quadrant of left female breast: Secondary | ICD-10-CM | POA: Diagnosis not present

## 2015-08-26 DIAGNOSIS — Z170 Estrogen receptor positive status [ER+]: Secondary | ICD-10-CM | POA: Diagnosis not present

## 2015-08-26 NOTE — Progress Notes (Signed)
  Radiation Oncology         [phone] ________________________________  Name: Karen Lawrence MRN: [id number]  Date: 08/26/2015  DOB: 10-09-1955  Follow-Up Visit Note  Outpatient  CC: Lilian Coma, MD  Neldon Mc, MD   ICD-9-CM ICD-10-CM   1. Malignant neoplasm of lower-outer quadrant of left female breast ([place]) 174.5 C50.512     Diagnosis and Prior Radiotherapy:   left breast, DCIS - ER100%, PR 7%  Indication for treatment: curative  Radiation treatment dates: 11/28/2012-12/25/2012  Site/dose: 1) Left Breast/ 40.05 Gy in 15 fractions  2) Left Breast Boost / 10 Gy in 5 fractions   Narrative:  Doing well, not taking antiestrogen medication.  Mammogram this month was NED.  No pain, no new complaints.                 ALLERGIES:  has No Known Allergies.  Meds: Current Outpatient Prescriptions  Medication Sig Dispense Refill  . cholecalciferol (VITAMIN D) 1000 UNITS tablet Take 1,000 Units by mouth daily.    . IODINE, KELP, PO Take 1 capsule by mouth daily.     . Multiple Vitamin (MULTIVITAMIN WITH MINERALS) TABS Take 1 tablet by mouth daily.    Marland Kitchen triamterene-hydrochlorothiazide (MAXZIDE-25) 37.5-25 MG per tablet Take 1 tablet by mouth daily. Pt taking 1/2 tablet     No current facility-administered medications for this encounter.    Physical Findings: The patient is in no acute distress. Patient is alert and oriented.  height is 5\' 4"  (1.626 m) and weight is 139 lb 1.6 oz (63.095 kg). Her temperature is 97.5 F (36.4 C). Her blood pressure is 137/73 and her pulse is 64. Her oxygen saturation is 100%. .   General: Alert and oriented, in no acute distress   Neck: Neck is supple, no palpable cervical or supraclavicular lymphadenopathy.  Abdomen: Soft, nontender, nondistended, with no rigidity or guarding. Extremities: No cyanosis or edema. Lymphatics: see Neck Exam Skin:healed well over the left breast. Breast: Both breasts and axillary regions are without any  palpable masses  Lab Findings: Lab Results  Component Value Date   WBC 4.9 09/10/2013   HGB 11.8 09/10/2013   HCT 36.8 09/10/2013   MCV 94.4 09/10/2013   PLT 197 09/10/2013    Radiographic Findings: as above  Impression/Plan:  NED. She is doing Recruitment consultant. F/u in 1 yr. Sooner if needed. Pt declines survivorship referral. Continue yearly mammograms. _____________________________________   Eppie Gibson, MD  This document serves as a record of services personally performed by Eppie Gibson, MD. It was created on her behalf by Derek Mound, a trained medical scribe. The creation of this record is based on the scribe's personal observations and the provider's statements to them. This document has been checked and approved by the attending provider.

## 2015-08-26 NOTE — Progress Notes (Signed)
Karen Lawrence presents for follow up of radiation completed 12/25/12 to her Left Breast. She denies pain at this time. She is doing well, a Mammogram was performed 08/19/15. She is not taking antiestrogen medicine.   BP 137/73 mmHg  Pulse 64  Temp(Src) 97.5 F (36.4 C)  Ht 5\' 4"  (1.626 m)  Wt 139 lb 1.6 oz (63.095 kg)  BMI 23.86 kg/m2  SpO2 100%   Wt Readings from Last 3 Encounters:  08/26/15 139 lb 1.6 oz (63.095 kg)  08/28/14 136 lb (61.689 kg)  08/27/14 136 lb (61.689 kg)

## 2015-08-27 ENCOUNTER — Telehealth: Payer: Self-pay | Admitting: *Deleted

## 2015-08-27 NOTE — Telephone Encounter (Signed)
CALLED PATIENT TO INFORM OF 1 YR. FU, NO ANSWER,  MAILED APPT. CARD

## 2015-10-20 DIAGNOSIS — E559 Vitamin D deficiency, unspecified: Secondary | ICD-10-CM | POA: Diagnosis not present

## 2015-10-20 DIAGNOSIS — E279 Disorder of adrenal gland, unspecified: Secondary | ICD-10-CM | POA: Diagnosis not present

## 2015-10-20 DIAGNOSIS — E641 Sequelae of vitamin A deficiency: Secondary | ICD-10-CM | POA: Diagnosis not present

## 2015-10-20 DIAGNOSIS — R739 Hyperglycemia, unspecified: Secondary | ICD-10-CM | POA: Diagnosis not present

## 2015-10-20 DIAGNOSIS — E7210 Disorders of sulfur-bearing amino-acid metabolism, unspecified: Secondary | ICD-10-CM | POA: Diagnosis not present

## 2015-11-09 DIAGNOSIS — C50512 Malignant neoplasm of lower-outer quadrant of left female breast: Secondary | ICD-10-CM | POA: Diagnosis not present

## 2015-12-11 DIAGNOSIS — R591 Generalized enlarged lymph nodes: Secondary | ICD-10-CM | POA: Diagnosis not present

## 2015-12-12 ENCOUNTER — Other Ambulatory Visit: Payer: Self-pay | Admitting: Family Medicine

## 2015-12-12 DIAGNOSIS — R591 Generalized enlarged lymph nodes: Secondary | ICD-10-CM

## 2015-12-21 ENCOUNTER — Ambulatory Visit
Admission: RE | Admit: 2015-12-21 | Discharge: 2015-12-21 | Disposition: A | Discharge status: Home / Self Care | Payer: BLUE CROSS/BLUE SHIELD | Source: Ambulatory Visit | Attending: Family Medicine | Admitting: Family Medicine

## 2015-12-21 DIAGNOSIS — R591 Generalized enlarged lymph nodes: Secondary | ICD-10-CM

## 2015-12-21 DIAGNOSIS — R590 Localized enlarged lymph nodes: Secondary | ICD-10-CM | POA: Diagnosis not present

## 2015-12-23 ENCOUNTER — Telehealth: Payer: Self-pay

## 2015-12-23 NOTE — Telephone Encounter (Signed)
I received a phone call from Ms. Surowiec. She had an Ultrasound of her Left Neck yesterday ordered by Dr. Laqueta Jean. She asked that Dr. Isidore Moos call her and talk to her about the results. I have contacted Dr. Isidore Moos to make her aware of this request.

## 2015-12-24 ENCOUNTER — Encounter: Payer: Self-pay | Admitting: Radiation Oncology

## 2015-12-24 NOTE — Progress Notes (Signed)
Returned call to patient regarding her questions about a neck ultrasound ordered by another provider after she had a fever/ cold like symptoms.  Reviewed the reassuring report with her and let her know to reach out an MD if any lumps/bumps in neck grow in future.  She expressed appreciation for the call. -----------------------------------  Eppie Gibson, MD

## 2015-12-28 NOTE — Telephone Encounter (Signed)
error 

## 2016-02-05 ENCOUNTER — Other Ambulatory Visit: Payer: Self-pay | Admitting: Radiation Oncology

## 2016-02-05 DIAGNOSIS — Z1231 Encounter for screening mammogram for malignant neoplasm of breast: Secondary | ICD-10-CM

## 2016-02-05 DIAGNOSIS — Z853 Personal history of malignant neoplasm of breast: Secondary | ICD-10-CM

## 2016-06-29 DIAGNOSIS — I10 Essential (primary) hypertension: Secondary | ICD-10-CM | POA: Diagnosis not present

## 2016-06-29 DIAGNOSIS — Z79899 Other long term (current) drug therapy: Secondary | ICD-10-CM | POA: Diagnosis not present

## 2016-06-29 DIAGNOSIS — Z0000 Encounter for general adult medical examination without abnormal findings: Secondary | ICD-10-CM | POA: Diagnosis not present

## 2016-06-29 DIAGNOSIS — E7800 Pure hypercholesterolemia, unspecified: Secondary | ICD-10-CM | POA: Diagnosis not present

## 2016-06-29 DIAGNOSIS — E559 Vitamin D deficiency, unspecified: Secondary | ICD-10-CM | POA: Diagnosis not present

## 2016-07-19 DIAGNOSIS — K635 Polyp of colon: Secondary | ICD-10-CM | POA: Diagnosis not present

## 2016-07-19 DIAGNOSIS — K5730 Diverticulosis of large intestine without perforation or abscess without bleeding: Secondary | ICD-10-CM | POA: Diagnosis not present

## 2016-07-19 DIAGNOSIS — Z86010 Personal history of colonic polyps: Secondary | ICD-10-CM | POA: Diagnosis not present

## 2016-07-19 DIAGNOSIS — K640 First degree hemorrhoids: Secondary | ICD-10-CM | POA: Diagnosis not present

## 2016-07-21 DIAGNOSIS — K635 Polyp of colon: Secondary | ICD-10-CM | POA: Diagnosis not present

## 2016-08-19 ENCOUNTER — Ambulatory Visit
Admission: RE | Admit: 2016-08-19 | Discharge: 2016-08-19 | Disposition: A | Discharge status: Home / Self Care | Payer: BLUE CROSS/BLUE SHIELD | Source: Ambulatory Visit | Attending: Radiation Oncology | Admitting: Radiation Oncology

## 2016-08-19 DIAGNOSIS — Z853 Personal history of malignant neoplasm of breast: Secondary | ICD-10-CM

## 2016-08-19 DIAGNOSIS — R928 Other abnormal and inconclusive findings on diagnostic imaging of breast: Secondary | ICD-10-CM | POA: Diagnosis not present

## 2016-08-19 HISTORY — DX: Personal history of irradiation: Z92.3

## 2016-08-24 ENCOUNTER — Ambulatory Visit
Admission: RE | Admit: 2016-08-24 | Discharge: 2016-08-24 | Disposition: A | Discharge status: Home / Self Care | Payer: BLUE CROSS/BLUE SHIELD | Source: Ambulatory Visit | Attending: Radiation Oncology | Admitting: Radiation Oncology

## 2016-08-24 ENCOUNTER — Telehealth: Payer: Self-pay | Admitting: *Deleted

## 2016-08-24 ENCOUNTER — Encounter: Payer: Self-pay | Admitting: Radiation Oncology

## 2016-08-24 VITALS — BP 163/74 | HR 65 | Temp 97.8°F | Ht 64.0 in | Wt 138.4 lb

## 2016-08-24 DIAGNOSIS — Z08 Encounter for follow-up examination after completed treatment for malignant neoplasm: Secondary | ICD-10-CM | POA: Diagnosis not present

## 2016-08-24 DIAGNOSIS — C50512 Malignant neoplasm of lower-outer quadrant of left female breast: Secondary | ICD-10-CM | POA: Insufficient documentation

## 2016-08-24 DIAGNOSIS — Z170 Estrogen receptor positive status [ER+]: Secondary | ICD-10-CM | POA: Insufficient documentation

## 2016-08-24 DIAGNOSIS — Z86000 Personal history of in-situ neoplasm of breast: Secondary | ICD-10-CM | POA: Diagnosis not present

## 2016-08-24 DIAGNOSIS — R030 Elevated blood-pressure reading, without diagnosis of hypertension: Secondary | ICD-10-CM | POA: Diagnosis not present

## 2016-08-24 NOTE — Progress Notes (Signed)
Radiation Oncology         [phone] ________________________________  Name: Karen Lawrence MRN: [id number]  Date: 08/24/2016  DOB: 06-Aug-1955  Follow-Up Visit Note  Outpatient  CC: Jonathon Jordan, MD  Neldon Mc, MD   ICD-10-CM   1. Malignant neoplasm of lower-outer quadrant of left breast of female, estrogen receptor positive ([place]) C50.512    Z17.0     Diagnosis and Prior Radiotherapy:   left breast cancer, STAGE 0, DCIS - ER100%, PR 7%  11/28/12 - 12/25/12 :  1) Left Breast/ 40.05 Gy in 15 fractions  2) Left Breast Boost / 10 Gy in 5 fractions   Narrative:  Karen Lawrence returns today for folow up of radiation completed 12/25/12. Mammogram on 08/19/16 showed no evidence for malignancy involving either breast.  On review of systems, she denies pain. She is concerned about her BP today, which was 163/74. Otherwise, she is doing well and denies any concerns. She denies any lumps or pains to either breast. She reports she is exercising daily.           ALLERGIES:  has No Known Allergies.  Meds: Current Outpatient Prescriptions  Medication Sig Dispense Refill  . cholecalciferol (VITAMIN D) 1000 UNITS tablet Take 1,000 Units by mouth daily.    . IODINE, KELP, PO Take 1 capsule by mouth daily.     . Multiple Vitamin (MULTIVITAMIN WITH MINERALS) TABS Take 1 tablet by mouth daily.    Marland Kitchen triamterene-hydrochlorothiazide (MAXZIDE-25) 37.5-25 MG per tablet Take 1 tablet by mouth daily. Pt taking 1/2 tablet     No current facility-administered medications for this encounter.    REVIEW OF SYSTEMS: A 10+ POINT REVIEW OF SYSTEMS WAS OBTAINED including neurology, dermatology, psychiatry, cardiac, respiratory, lymph, extremities, GI, GU, musculoskeletal, constitutional, reproductive, HEENT. All pertinent positives are noted in the HPI. All others are negative.  Physical Findings: The patient is in no acute distress. Patient is alert and oriented.  height is 5\' 4"  (1.626 m) and  weight is 138 lb 6.4 oz (62.8 kg). Her temperature is 97.8 F (36.6 C). Her blood pressure is 163/74 (abnormal) and her pulse is 65. Her oxygen saturation is 100%.  General: Alert and oriented, in no acute distress. HEENT: Head is normocephalic. Neck: Neck is supple, no palpable cervical or supraclavicular lymphadenopathy. Heart: Regular in rate and rhythm, with no murmurs. Chest: Lungs are clear to auscultation bilaterally. Abdomen: Soft, non tender, non distended. Extremities: No edema in lower extremities. Lymphatics: see Neck Exam Skin:healed well over the left breast. Breast: There is a left lumpectomy scar in the lower outer quadrant which has healed well. No palpable masses in the left breast or left axillary region. Right breast and axillary region are normal appearing on exam, without masses or adenopathy.  Lab Findings: Lab Results  Component Value Date   WBC 4.9 09/10/2013   HGB 11.8 09/10/2013   HCT 36.8 09/10/2013   MCV 94.4 09/10/2013   PLT 197 09/10/2013    Radiographic Findings: as above  Impression/Plan:  NED on clinical exam or recent mammogram.  The patient will continue to routinely monitor her BP at home - she states it's always normal at home and at PCP's - and contact her PCP for follow up if it remains elevated.  Follow up in 1 year. The patient will return sooner if needed. She will continue with regular yearly mammograms.  I spent 15 minutes face to face with the patient and more than 50% of  that time was spent in counseling and/or coordination of care. _____________________________________   Eppie Gibson, MD  This document serves as a record of services personally performed by Eppie Gibson, MD. It was created on her behalf by Maryla Morrow, a trained medical scribe. The creation of this record is based on the scribe's personal observations and the provider's statements to them. This document has been checked and approved by the attending provider.

## 2016-08-24 NOTE — Telephone Encounter (Signed)
CALLED PATIENT TO INFORM OF FU VISIT ON 08-23-2017 @ 11:20 AM WITH DR. Isidore Moos, SPOKE WITH PATIENT AND SHE IS AWARE OF THIS APPT.

## 2016-08-24 NOTE — Progress Notes (Signed)
Ms. Cilia presents for follow up of radiation completed 12/25/12 to her Left Breast. She denies pain. She is concerned about her blood pressure reading today. It was 163/74. She is otherwise doing well and denies any other concerns at this time. She tells me that her most recent mammogram was normal. She is exercising daily.   BP (!) 163/74   Pulse 65   Temp 97.8 F (36.6 C)   Ht 5\' 4"  (1.626 m)   Wt 138 lb 6.4 oz (62.8 kg)   SpO2 100% Comment: room air  BMI 23.76 kg/m    Wt Readings from Last 3 Encounters:  08/24/16 138 lb 6.4 oz (62.8 kg)  08/26/15 139 lb 1.6 oz (63.1 kg)  08/28/14 136 lb (61.7 kg)

## 2016-08-25 ENCOUNTER — Ambulatory Visit: Payer: Self-pay | Admitting: Radiation Oncology

## 2016-09-24 DIAGNOSIS — I10 Essential (primary) hypertension: Secondary | ICD-10-CM | POA: Diagnosis not present

## 2016-09-24 DIAGNOSIS — S91302A Unspecified open wound, left foot, initial encounter: Secondary | ICD-10-CM | POA: Diagnosis not present

## 2016-11-14 DIAGNOSIS — C50512 Malignant neoplasm of lower-outer quadrant of left female breast: Secondary | ICD-10-CM | POA: Diagnosis not present

## 2016-11-23 DIAGNOSIS — E2839 Other primary ovarian failure: Secondary | ICD-10-CM | POA: Diagnosis not present

## 2016-11-23 DIAGNOSIS — M8588 Other specified disorders of bone density and structure, other site: Secondary | ICD-10-CM | POA: Diagnosis not present

## 2016-11-27 DIAGNOSIS — S90852A Superficial foreign body, left foot, initial encounter: Secondary | ICD-10-CM | POA: Diagnosis not present

## 2016-11-28 ENCOUNTER — Ambulatory Visit: Payer: Self-pay | Admitting: Podiatry

## 2016-12-05 ENCOUNTER — Other Ambulatory Visit: Payer: Self-pay | Admitting: Family Medicine

## 2016-12-05 DIAGNOSIS — Z1231 Encounter for screening mammogram for malignant neoplasm of breast: Secondary | ICD-10-CM

## 2016-12-07 ENCOUNTER — Ambulatory Visit (INDEPENDENT_AMBULATORY_CARE_PROVIDER_SITE_OTHER): Payer: BLUE CROSS/BLUE SHIELD | Admitting: Podiatry

## 2016-12-07 ENCOUNTER — Encounter: Payer: Self-pay | Admitting: Podiatry

## 2016-12-07 ENCOUNTER — Other Ambulatory Visit: Payer: Self-pay | Admitting: Podiatry

## 2016-12-07 ENCOUNTER — Ambulatory Visit (INDEPENDENT_AMBULATORY_CARE_PROVIDER_SITE_OTHER): Payer: BLUE CROSS/BLUE SHIELD

## 2016-12-07 VITALS — BP 137/71 | HR 63 | Resp 16

## 2016-12-07 DIAGNOSIS — S99921A Unspecified injury of right foot, initial encounter: Secondary | ICD-10-CM

## 2016-12-07 DIAGNOSIS — M79671 Pain in right foot: Secondary | ICD-10-CM | POA: Diagnosis not present

## 2016-12-07 DIAGNOSIS — M79672 Pain in left foot: Secondary | ICD-10-CM

## 2016-12-07 DIAGNOSIS — B079 Viral wart, unspecified: Secondary | ICD-10-CM

## 2016-12-07 DIAGNOSIS — L859 Epidermal thickening, unspecified: Secondary | ICD-10-CM | POA: Diagnosis not present

## 2016-12-07 NOTE — Patient Instructions (Signed)

## 2016-12-07 NOTE — Progress Notes (Signed)
Subjective:    Patient ID: Karen Lawrence, female   DOB: 61 y.o.   MRN: [id number]   HPI patient states that she thinks he stepped on something on her right foot around 8 months ago and it's been bothering her since but she did not have any bleeding. On the left she did traumatized the top of her foot approximate 1 month ago and had stitches and she feels like it's prominent in the portion of the closest her ankle and it can bother her with shoes    Review of Systems  All other systems reviewed and are negative.       Objective:  Physical Exam  Constitutional: She appears well-developed and well-nourished.  Cardiovascular: Intact distal pulses.   Pulmonary/Chest: Effort normal.  Musculoskeletal: Normal range of motion.  Neurological: She is alert.  Skin: Skin is warm.  Nursing note and vitals reviewed.  nerovascular status found to be intact with a lesion measuring approximately 6 mm on the plantar aspect of the right heel that upon debridement shows pinpoint bleeding pain to lateral pressure. There is also on the dorsum of the left and approximate 1 cm incision on the dorsal foot with a small area of irritation in the proximal portion and what she feels like the possibility that there may be a small stitch that still in there from the previous stitching that was done by another physician     Assessment:   Probability for a verruca plantaris plantar right versus other unknown mass or porokeratosis and possibility for a small stitch which may have been left in an incision on the left dorsal foot      Plan:    H&P x-rays reviewed do to chronic foot pain and at this point for the plantar right heel I did inject 60 mg I can with epinephrine I did explain removal of the lesion and the fact that it may recur and risk associated with lesion removal. She wants procedure and at this time I went ahead and I using sterile his mentation circumscribed and took lesion out in the did have the  appearance of verruca and I applied sterile dressing and sent for pathological evaluation. On the left I did a sterile prep of the area after anesthetizing it and took a small probe and probed the small area of irritation and did not note currently that there was any suture so went ahead and applied sterile dressing and instructed her on soaks and that if it should give her trouble to reappoint  X-rays indicate there is no indications of calcification or any form of bone pathology

## 2017-08-10 NOTE — Progress Notes (Signed)
Ms. Karen Lawrence presents for follow up of radiation completed 12/25/12 to her left breast. She is concerned about her blood pressure today. It was 154/80 in the office today, but she took it with a wrist BP cuff while in the waiting room and it was 124/70. She denies concerns related to her Breast radiation. She does ask for a prescription for liquid iodine today. She had a mammogram on 08/21/17 ordered by Dr. Stephanie Acre and she reports this as normal today.  BP (!) 154/80   Pulse 68   Temp 97.6 F (36.4 C)   Resp 18   Wt 136 lb 12.8 oz (62.1 kg)   SpO2 100% Comment: room air  BMI 23.48 kg/m    Wt Readings from Last 3 Encounters:  08/23/17 136 lb 12.8 oz (62.1 kg)  08/24/16 138 lb 6.4 oz (62.8 kg)  08/26/15 139 lb 1.6 oz (63.1 kg)

## 2017-08-21 ENCOUNTER — Ambulatory Visit
Admission: RE | Admit: 2017-08-21 | Discharge: 2017-08-21 | Disposition: A | Discharge status: Home / Self Care | Payer: BLUE CROSS/BLUE SHIELD | Source: Ambulatory Visit | Attending: Family Medicine | Admitting: Family Medicine

## 2017-08-21 ENCOUNTER — Other Ambulatory Visit: Payer: Self-pay | Admitting: Family Medicine

## 2017-08-21 ENCOUNTER — Other Ambulatory Visit: Payer: Self-pay | Admitting: Radiation Oncology

## 2017-08-21 DIAGNOSIS — Z853 Personal history of malignant neoplasm of breast: Secondary | ICD-10-CM | POA: Diagnosis not present

## 2017-08-21 DIAGNOSIS — R928 Other abnormal and inconclusive findings on diagnostic imaging of breast: Secondary | ICD-10-CM | POA: Diagnosis not present

## 2017-08-21 DIAGNOSIS — Z1231 Encounter for screening mammogram for malignant neoplasm of breast: Secondary | ICD-10-CM

## 2017-08-21 DIAGNOSIS — Z5111 Encounter for antineoplastic chemotherapy: Secondary | ICD-10-CM | POA: Diagnosis not present

## 2017-08-23 ENCOUNTER — Encounter: Payer: Self-pay | Admitting: Radiation Oncology

## 2017-08-23 ENCOUNTER — Ambulatory Visit
Admission: RE | Admit: 2017-08-23 | Discharge: 2017-08-23 | Disposition: A | Discharge status: Home / Self Care | Payer: BLUE CROSS/BLUE SHIELD | Source: Ambulatory Visit | Attending: Radiation Oncology | Admitting: Radiation Oncology

## 2017-08-23 ENCOUNTER — Other Ambulatory Visit: Payer: Self-pay

## 2017-08-23 VITALS — BP 154/80 | HR 68 | Temp 97.6°F | Resp 18 | Wt 136.8 lb

## 2017-08-23 DIAGNOSIS — Z853 Personal history of malignant neoplasm of breast: Secondary | ICD-10-CM | POA: Insufficient documentation

## 2017-08-23 DIAGNOSIS — C50512 Malignant neoplasm of lower-outer quadrant of left female breast: Secondary | ICD-10-CM

## 2017-08-23 DIAGNOSIS — Z170 Estrogen receptor positive status [ER+]: Secondary | ICD-10-CM | POA: Diagnosis not present

## 2017-08-23 DIAGNOSIS — Z79899 Other long term (current) drug therapy: Secondary | ICD-10-CM | POA: Insufficient documentation

## 2017-08-23 DIAGNOSIS — Z08 Encounter for follow-up examination after completed treatment for malignant neoplasm: Secondary | ICD-10-CM | POA: Insufficient documentation

## 2017-08-23 DIAGNOSIS — R030 Elevated blood-pressure reading, without diagnosis of hypertension: Secondary | ICD-10-CM | POA: Diagnosis not present

## 2017-08-23 DIAGNOSIS — Z923 Personal history of irradiation: Secondary | ICD-10-CM | POA: Diagnosis not present

## 2017-08-23 NOTE — Progress Notes (Signed)
Radiation Oncology         [phone] ________________________________  Name: Karen Lawrence MRN: [id number]  Date: 08/23/2017  DOB: 09/21/1955  Follow-Up Visit Note  Outpatient  CC: Jonathon Jordan, MD  Neldon Mc, MD   ICD-10-CM   1. Malignant neoplasm of lower-outer quadrant of left breast of female, estrogen receptor positive ([place]) C50.512    Z17.0     Diagnosis and Prior Radiotherapy:   left breast cancer, STAGE 0, DCIS - ER100%, PR 7%  11/28/12 - 12/25/12 :  1) Left Breast/ 40.05 Gy in 15 fractions  2) Left Breast Boost / 10 Gy in 5 fractions   Narrative:  Karen Lawrence returns today for folow up of radiation completed 12/25/12. Mammogram on 08/21/17 showed no evidence for malignancy involving either breast.  On review of systems, she endorses 267 systolic last night despite high reading today. She states that her pressures are consistently normal at other doctors offices.          ALLERGIES:  has No Known Allergies.  Meds: Current Outpatient Medications  Medication Sig Dispense Refill  . cholecalciferol (VITAMIN D) 1000 UNITS tablet Take 1,000 Units by mouth daily.    . IODINE, KELP, PO Take 1 capsule by mouth daily.     . Multiple Vitamin (MULTIVITAMIN WITH MINERALS) TABS Take 1 tablet by mouth daily.    Marland Kitchen triamterene-hydrochlorothiazide (MAXZIDE-25) 37.5-25 MG per tablet Take 1 tablet by mouth daily. Pt taking 1/2 tablet     No current facility-administered medications for this encounter.    REVIEW OF SYSTEMS:as above  Physical Findings: The patient is in no acute distress. Patient is alert and oriented.  weight is 136 lb 12.8 oz (62.1 kg). Her temperature is 97.6 F (36.4 C). Her blood pressure is 154/80 (abnormal) and her pulse is 68. Her respiration is 18 and oxygen saturation is 100%.  Skin:healed well over the left breast. Breast:  No palpable axillary masses in the left breast or left axillary region. Right breast and axillary region are normal  appearing on exam, without masses or adenopathy. Some palpable scar tissue medial to her left lumpectomy scar.  Lab Findings: Lab Results  Component Value Date   WBC 4.9 09/10/2013   HGB 11.8 09/10/2013   HCT 36.8 09/10/2013   MCV 94.4 09/10/2013   PLT 197 09/10/2013    Radiographic Findings: as above Mm Diag Breast Tomo Bilateral  Result Date: 08/21/2017 CLINICAL DATA:  Status post left lumpectomy for breast carcinoma in 2014 with adjuvant radiation therapy.Patient has no current breast complaints. EXAM: DIGITAL DIAGNOSTIC BILATERAL MAMMOGRAM WITH CAD AND TOMO COMPARISON:  Previous exam(s). ACR Breast Density Category b: There are scattered areas of fibroglandular density. FINDINGS: Architectural distortion in the lateral left breast reflecting the post lumpectomy scarring is stable from prior exams. There are no discrete masses or other areas of architectural distortion. There are no new or suspicious calcifications. Mammographic images were processed with CAD. IMPRESSION: 1. No evidence of recurrent or new breast malignancy. 2. Benign postsurgical changes on the left. RECOMMENDATION: Screening mammogram in one year.(Code:[id number]-B-01Y) I have discussed the findings and recommendations with the patient. Results were also provided in writing at the conclusion of the visit. If applicable, a reminder letter will be sent to the patient regarding the next appointment. BI-RADS CATEGORY  2: Benign. Electronically Signed   By: Lajean Manes M.D.   On: 08/21/2017 12:38    Impression/Plan:  NED on clinical exam or recent mammogram at 1  years post diagnosis.  The patient will continue to routinely monitor her BP at home and with her PCP. She was interested in a prescription for liquid iodine to apply to her left breast. Since I am not familiar with this intervention, I recommended she discuss this further with her naturopath.   Follow up prn. She will continue with regular yearly mammograms.  I wished her  the best and encouraged her to call any time I can be of help.  I spent at least 15 minutes face to face with the patient and more than 50% of that time was spent in counseling and/or coordination of care. _____________________________________   Eppie Gibson, MD  This document serves as a record of services personally performed by Eppie Gibson, MD. It was created on her behalf by Linward Natal, a trained medical scribe. The creation of this record is based on the scribe's personal observations and the provider's statements to them. This document has been checked and approved by the attending provider.

## 2017-09-28 ENCOUNTER — Other Ambulatory Visit: Payer: Self-pay | Admitting: Family Medicine

## 2017-09-28 ENCOUNTER — Other Ambulatory Visit (HOSPITAL_COMMUNITY)
Admission: RE | Admit: 2017-09-28 | Discharge: 2017-09-28 | Disposition: A | Discharge status: Home / Self Care | Payer: BLUE CROSS/BLUE SHIELD | Source: Ambulatory Visit | Attending: Family Medicine | Admitting: Family Medicine

## 2017-09-28 DIAGNOSIS — E559 Vitamin D deficiency, unspecified: Secondary | ICD-10-CM | POA: Diagnosis not present

## 2017-09-28 DIAGNOSIS — Z01411 Encounter for gynecological examination (general) (routine) with abnormal findings: Secondary | ICD-10-CM | POA: Insufficient documentation

## 2017-09-28 DIAGNOSIS — I10 Essential (primary) hypertension: Secondary | ICD-10-CM | POA: Diagnosis not present

## 2017-09-28 DIAGNOSIS — Z0000 Encounter for general adult medical examination without abnormal findings: Secondary | ICD-10-CM | POA: Diagnosis not present

## 2017-09-28 DIAGNOSIS — Z79899 Other long term (current) drug therapy: Secondary | ICD-10-CM | POA: Diagnosis not present

## 2017-10-02 LAB — CYTOLOGY - PAP
Diagnosis: NEGATIVE
HPV (WINDOPATH): NOT DETECTED

## 2017-11-17 DIAGNOSIS — C50512 Malignant neoplasm of lower-outer quadrant of left female breast: Secondary | ICD-10-CM | POA: Diagnosis not present

## 2018-06-15 ENCOUNTER — Other Ambulatory Visit: Payer: Self-pay | Admitting: Family Medicine

## 2018-06-15 DIAGNOSIS — Z1231 Encounter for screening mammogram for malignant neoplasm of breast: Secondary | ICD-10-CM

## 2018-08-16 DIAGNOSIS — E559 Vitamin D deficiency, unspecified: Secondary | ICD-10-CM | POA: Diagnosis not present

## 2018-08-16 DIAGNOSIS — E782 Mixed hyperlipidemia: Secondary | ICD-10-CM | POA: Diagnosis not present

## 2018-08-16 DIAGNOSIS — N951 Menopausal and female climacteric states: Secondary | ICD-10-CM | POA: Diagnosis not present

## 2018-08-16 DIAGNOSIS — R5383 Other fatigue: Secondary | ICD-10-CM | POA: Diagnosis not present

## 2018-08-16 DIAGNOSIS — E063 Autoimmune thyroiditis: Secondary | ICD-10-CM | POA: Diagnosis not present

## 2018-08-16 DIAGNOSIS — E039 Hypothyroidism, unspecified: Secondary | ICD-10-CM | POA: Diagnosis not present

## 2018-08-23 ENCOUNTER — Ambulatory Visit
Admission: RE | Admit: 2018-08-23 | Discharge: 2018-08-23 | Disposition: A | Discharge status: Home / Self Care | Payer: BLUE CROSS/BLUE SHIELD | Source: Ambulatory Visit | Attending: Family Medicine | Admitting: Family Medicine

## 2018-08-23 ENCOUNTER — Other Ambulatory Visit: Payer: Self-pay

## 2018-08-23 DIAGNOSIS — Z1231 Encounter for screening mammogram for malignant neoplasm of breast: Secondary | ICD-10-CM | POA: Diagnosis not present

## 2018-11-27 DIAGNOSIS — E559 Vitamin D deficiency, unspecified: Secondary | ICD-10-CM | POA: Diagnosis not present

## 2018-11-27 DIAGNOSIS — Z79899 Other long term (current) drug therapy: Secondary | ICD-10-CM | POA: Diagnosis not present

## 2018-11-27 DIAGNOSIS — I10 Essential (primary) hypertension: Secondary | ICD-10-CM | POA: Diagnosis not present

## 2018-11-27 DIAGNOSIS — Z0000 Encounter for general adult medical examination without abnormal findings: Secondary | ICD-10-CM | POA: Diagnosis not present

## 2018-11-27 DIAGNOSIS — Z23 Encounter for immunization: Secondary | ICD-10-CM | POA: Diagnosis not present

## 2018-12-14 DIAGNOSIS — R946 Abnormal results of thyroid function studies: Secondary | ICD-10-CM | POA: Diagnosis not present

## 2019-01-01 DIAGNOSIS — Z7189 Other specified counseling: Secondary | ICD-10-CM | POA: Diagnosis not present

## 2019-01-01 DIAGNOSIS — Z6823 Body mass index (BMI) 23.0-23.9, adult: Secondary | ICD-10-CM | POA: Diagnosis not present

## 2019-01-01 DIAGNOSIS — E0590 Thyrotoxicosis, unspecified without thyrotoxic crisis or storm: Secondary | ICD-10-CM | POA: Diagnosis not present

## 2019-07-30 ENCOUNTER — Other Ambulatory Visit: Payer: Self-pay | Admitting: Family Medicine

## 2019-07-30 DIAGNOSIS — Z1231 Encounter for screening mammogram for malignant neoplasm of breast: Secondary | ICD-10-CM

## 2019-08-27 ENCOUNTER — Other Ambulatory Visit: Payer: Self-pay

## 2019-08-27 ENCOUNTER — Ambulatory Visit
Admission: RE | Admit: 2019-08-27 | Discharge: 2019-08-27 | Disposition: A | Discharge status: Home / Self Care | Payer: Medicare Other | Source: Ambulatory Visit | Attending: Family Medicine | Admitting: Family Medicine

## 2019-08-27 DIAGNOSIS — Z1231 Encounter for screening mammogram for malignant neoplasm of breast: Secondary | ICD-10-CM

## 2020-07-28 ENCOUNTER — Other Ambulatory Visit: Payer: Self-pay | Admitting: Family Medicine

## 2020-07-28 DIAGNOSIS — Z1231 Encounter for screening mammogram for malignant neoplasm of breast: Secondary | ICD-10-CM

## 2020-09-22 ENCOUNTER — Ambulatory Visit
Admission: RE | Admit: 2020-09-22 | Discharge: 2020-09-22 | Disposition: A | Discharge status: Home / Self Care | Payer: Medicare Other | Source: Ambulatory Visit | Attending: Family Medicine | Admitting: Family Medicine

## 2020-09-22 ENCOUNTER — Other Ambulatory Visit: Payer: Self-pay

## 2020-09-22 DIAGNOSIS — Z1231 Encounter for screening mammogram for malignant neoplasm of breast: Secondary | ICD-10-CM

## 2020-09-24 ENCOUNTER — Other Ambulatory Visit: Payer: Self-pay | Admitting: Family Medicine

## 2020-09-24 DIAGNOSIS — R928 Other abnormal and inconclusive findings on diagnostic imaging of breast: Secondary | ICD-10-CM

## 2020-09-26 ENCOUNTER — Other Ambulatory Visit: Payer: Self-pay | Admitting: Family Medicine

## 2020-09-26 ENCOUNTER — Ambulatory Visit
Admission: RE | Admit: 2020-09-26 | Discharge: 2020-09-26 | Disposition: A | Discharge status: Home / Self Care | Payer: Medicare Other | Source: Ambulatory Visit | Attending: Family Medicine | Admitting: Family Medicine

## 2020-09-26 DIAGNOSIS — R928 Other abnormal and inconclusive findings on diagnostic imaging of breast: Secondary | ICD-10-CM

## 2020-09-28 ENCOUNTER — Ambulatory Visit
Admission: RE | Admit: 2020-09-28 | Discharge: 2020-09-28 | Disposition: A | Discharge status: Home / Self Care | Payer: Medicare Other | Source: Ambulatory Visit | Attending: Family Medicine | Admitting: Family Medicine

## 2020-09-28 ENCOUNTER — Other Ambulatory Visit: Payer: Self-pay

## 2020-09-28 DIAGNOSIS — R928 Other abnormal and inconclusive findings on diagnostic imaging of breast: Secondary | ICD-10-CM

## 2020-10-01 ENCOUNTER — Other Ambulatory Visit: Payer: Medicare Other

## 2020-10-03 ENCOUNTER — Other Ambulatory Visit: Payer: Medicare Other

## 2020-11-06 ENCOUNTER — Other Ambulatory Visit: Payer: Self-pay | Admitting: General Surgery

## 2020-11-06 DIAGNOSIS — R928 Other abnormal and inconclusive findings on diagnostic imaging of breast: Secondary | ICD-10-CM

## 2020-11-06 DIAGNOSIS — Z853 Personal history of malignant neoplasm of breast: Secondary | ICD-10-CM

## 2020-11-17 ENCOUNTER — Ambulatory Visit
Admission: RE | Admit: 2020-11-17 | Discharge: 2020-11-17 | Disposition: A | Discharge status: Home / Self Care | Payer: Medicare Other | Source: Ambulatory Visit | Attending: General Surgery | Admitting: General Surgery

## 2020-11-17 ENCOUNTER — Other Ambulatory Visit: Payer: Self-pay

## 2020-11-17 DIAGNOSIS — R928 Other abnormal and inconclusive findings on diagnostic imaging of breast: Secondary | ICD-10-CM

## 2020-11-17 DIAGNOSIS — Z853 Personal history of malignant neoplasm of breast: Secondary | ICD-10-CM

## 2020-11-17 MED ORDER — GADOBUTROL 1 MMOL/ML IV SOLN
6.0000 mL | Freq: Once | INTRAVENOUS | Status: AC | PRN
  Administered 2020-11-17: 6 mL via INTRAVENOUS

## 2020-12-30 ENCOUNTER — Encounter: Payer: Self-pay | Admitting: Physical Therapy

## 2020-12-30 ENCOUNTER — Ambulatory Visit: Payer: Medicare Other | Attending: Obstetrics and Gynecology | Admitting: Physical Therapy

## 2020-12-30 ENCOUNTER — Other Ambulatory Visit: Payer: Self-pay

## 2020-12-30 DIAGNOSIS — M6281 Muscle weakness (generalized): Secondary | ICD-10-CM | POA: Insufficient documentation

## 2020-12-30 DIAGNOSIS — R279 Unspecified lack of coordination: Secondary | ICD-10-CM | POA: Diagnosis present

## 2020-12-30 NOTE — Patient Instructions (Addendum)

## 2020-12-30 NOTE — Therapy (Signed)
[facility] @ [place], [place], [address] Phone: [phone]   Fax:  [phone]  Physical Therapy Evaluation  Patient Details  Name: Karen Lawrence MRN: [id number] Date of Birth: 12-31-1955 Referring Provider (PT): Dian Queen, MD   Encounter Date: 12/30/2020   PT End of Session - 12/30/20 1321     Visit Number 1    Date for PT Re-Evaluation 04/01/21    Authorization Type Medicare    Progress Note Due on Visit 10    PT Start Time 1237    PT Stop Time 1319    PT Time Calculation (min) 42 min    Activity Tolerance Patient tolerated treatment well    Behavior During Therapy [facility] for tasks assessed/performed             Past Medical History:  Diagnosis Date   Anxiety    pt denies having aniexty.    Breast cancer ([place]) 07/2012   DCIS- Left Breast   History of radiation therapy 11/28/2012-12/25/2012   50.05 Gy to left breast   Hypertension    Personal history of radiation therapy 2014    Past Surgical History:  Procedure Laterality Date   BREAST LUMPECTOMY WITH NEEDLE LOCALIZATION Left 09/11/2012   Procedure: BREAST LUMPECTOMY WITH NEEDLE LOCALIZATION removal of left breast cancer ;  Surgeon: Haywood Lasso, MD;  Location: [facility];  Service: General;  Laterality: Left;   [place] EXTRACTION  2004    There were no vitals filed for this visit.    Subjective Assessment - 12/30/20 1238     Subjective Pt reports she has had symptoms of prolapse after most recent colonoscopy and thought it was hemorrhoids but did not have pain at anus, currently only has pain at rectum with straining for BM though she tried not to this for the past 4 years (since colonscopy) and has gotten progressively worse. Denied bleeding with BMs, fecal and urinary incontinence, no smearing, has BMs ~3x every day consistently a type 4 based on Bristol Stool Scale. Pt reports she doesn't usually feel like  she needs to strain to get stool out. Pt reports the prolapse is bothersome to her in knowing she has a prolapse and worries her but she doesn't have symptoms of prolapse unless she strains to have BM which is painful. Pt reports she is inconsistent on how long it takes her to start a BM from quickly being about to complete BM to having to sit for a several minutes.    How long can you sit comfortably? no limits    How long can you stand comfortably? no limits    How long can you walk comfortably? no limits    Patient Stated Goals to have improvement with prolapse    Currently in Pain? No/denies                [facility] PT Assessment - 12/30/20 0001       Assessment   Medical Diagnosis K62.3 (ICD-10-CM) - Rectal prolapse    Referring Provider (PT) Dian Queen, MD    Onset Date/Surgical Date --   4 years ago   Prior Therapy Not for PFPT      Precautions   Precautions None      Restrictions   Weight Bearing Restrictions No      Balance Screen   Has the patient fallen in the past 6 months No  Has the patient had a decrease in activity level because of a fear of falling?  No    Is the patient reluctant to leave their home because of a fear of falling?  No      Home Ecologist residence    Living Arrangements Spouse/significant other      Prior Function   Level of [place] Retired    Leisure workout, read, write      Cognition   Overall Cognitive Status Within Functional Limits for tasks assessed      Sensation   Light Touch Appears Intact      Coordination   Gross Motor Movements are Fluid and Coordinated Yes    Fine Motor Movements are Fluid and Coordinated Yes      Posture/Postural Control   Posture/Postural Control Postural limitations    Postural Limitations Rounded Shoulders;Posterior pelvic tilt      ROM / Strength   AROM / PROM / Strength AROM;Strength      AROM   Overall AROM Comments WFL at  spine, hips, knees      Strength   Overall Strength Comments hips grossly 4/5 throughout      Flexibility   Soft Tissue Assessment /Muscle Length yes   hamstrings bil limited by 25%     Palpation   Palpation comment no TTP at abdomen, glutes, quads, hamstrings, or abd/adductors                        Objective measurements completed on examination: See above findings.     Pelvic Floor Special Questions - 12/30/20 0001     Prior Pelvic/Prostate Exam No   not since prior to 64yo but previous to this all normal   Are you Pregnant or attempting pregnancy? No    Prior Pregnancies Yes    Number of Pregnancies 2    Number of C-Sections 1    Number of Vaginal Deliveries 1    Any difficulty with labor and deliveries No    Episiotomy Performed No    Diastasis Recti no    Currently Sexually Active Yes    Is this Painful No   denied dryness or irritation   History of sexually transmitted disease No    Marinoff Scale no problems    Urinary Leakage No    Urinary urgency No    Urinary frequency voids 3-4 hours    Fecal incontinence No    Fluid intake water intake is at 1/2 body weight in oz    Caffeine beverages no    Falling out feeling (prolapse) Yes    Activities that cause feeling of prolapse if needs to strain with BM    Prolapse --   pt did not consent to visual examination or internal examination of rectal prolapse   Pelvic Floor Internal Exam pt declined                       PT Education - 12/30/20 1321     Education Details Pt educated on voiding and breathing mechanics, female anatomy with model used, and HEP    Person(s) Educated Patient    Methods Explanation;Demonstration;Tactile cues;Verbal cues;Handout    Comprehension Verbalized understanding;Returned demonstration              PT Short Term Goals - 12/30/20 1331       PT SHORT TERM GOAL #1  Title Pt to be I with HEP    Time 4    Period Weeks    Status New    Target Date  01/27/21      PT SHORT TERM GOAL #2   Title pt to demonstrate at least 3/5 pelvic floor strength to improve prolapse symptoms    Time 4    Period Weeks    Status New    Target Date 01/27/21      PT SHORT TERM GOAL #3   Title pt to demonstrate improved breathing mechanics with activity at pelvic floor contract/relax at least 50% of the time to decrease strain at prolapse    Time 4    Period Weeks    Status New    Target Date 01/27/21               PT Long Term Goals - 12/30/20 1332       PT LONG TERM GOAL #1   Title Pt to be I with advanced HEP    Time 8    Period Weeks    Status New    Target Date 02/24/21      PT LONG TERM GOAL #2   Title pt to demonstrate at least 4/5 pelvic floor strength to improve prolapse symptoms    Time 8    Period Weeks    Status New    Target Date 02/24/21      PT LONG TERM GOAL #3   Title pt to demonstrate improved breathing mechanics with activity at pelvic floor contract/relax at least 75% of the time to decrease strain at prolapse    Time 8    Period Weeks    Status New    Target Date 02/24/21      PT LONG TERM GOAL #4   Title pt to demonstrate 5/5 bil global hip strength for improved stability at pelvis    Time 8    Period Weeks    Status New    Target Date 02/24/21                    Plan - 12/30/20 1322     Clinical Impression Statement Pt is 65yo female presenting to clinic with chronic history of rectal prolapse s/p colonoscopy 4 years ago. Pt reports she does not have symptoms of pain, heaviness, or bulge regularly or with exercises/activity but does have pain at rectum/EAS if she were to attempt to strain with BM. Pt reports she doesn't feel like she needs to do this "very rarely" but it bothers her knowing she has the prolapse and would like to work on strengthening pelvic floor to prevent rectal prolapse from worsening and if possible return tissue more upward. Pt reports she has x3 BMs per day at type 4  consistently, she works out daily, is very active, denies pain normally, and drinks enough water and eats healthy diet per pt. Pt reports no constipation symptoms. Pt had no not restrictions in abdomen, WFL ROM throughout spine, hips, and knees, very mild hip weakness in all directions. Pt declined consent for internal rectal or vaginal assessment of pelvic floor this session and also declined visual examination of rectal tissues to assess prolapse, ability to actively contract/relax/bulge. PT explained reasoning behind assessment and possible deficits that may contribute to prolapse and pt reported she understood but declined until she attempted mechanics at home to see if this helped. Pt did demonstrate chest breathing and benefited from multiple cues and  demonstrations to complete diaphragmatic breathing pattern, educated on this, pelvic contractions, and voiding mechanics with handouts given. Pt would benefit from additional PT to address deficits with prolapse and possibly internal exam if pt consents.    Personal Factors and Comorbidities Time since onset of injury/illness/exacerbation;Comorbidity 1    Comorbidities x2 births (vaginal and c-section)    Examination-Activity Limitations Toileting    Examination-Participation Restrictions Other   BMs   Stability/Clinical Decision Making Stable/Uncomplicated    Clinical Decision Making Low    Rehab Potential Good    PT Frequency 1x / week    PT Duration 8 weeks    PT Treatment/Interventions ADLs/Self Care Home Management;Aquatic Therapy;Functional mobility training;Therapeutic activities;Therapeutic exercise;Neuromuscular re-education;Manual techniques;Patient/family education;Scar mobilization;Passive range of motion    PT Next Visit Plan internal if agreeable, go over handouts    PT Home Exercise Plan [id number]    Consulted and Agree with Plan of Care Patient             Patient will benefit from skilled therapeutic intervention in order to  improve the following deficits and impairments:  Decreased coordination, Decreased endurance, Pain, Decreased mobility, Decreased strength, Postural dysfunction, Improper body mechanics, Impaired flexibility  Visit Diagnosis: Lack of coordination - Plan: PT plan of care cert/re-cert  Muscle weakness (generalized) - Plan: PT plan of care cert/re-cert     Problem List Patient Active Problem List   Diagnosis Date Noted   Malignant neoplasm of lower-outer quadrant of female breast ([place]) 10/03/2012   Breast cancer, left breast, DCIS 08/10/2012   Stacy Gardner, PT, DPT 11/02/221:35 PM   [facility] @ [address] [place], [place], [address] Phone: [phone]   Fax:  [phone]  Name: Karen Lawrence MRN: [id number] Date of Birth: September 14, 1955

## 2021-01-06 ENCOUNTER — Ambulatory Visit: Payer: Medicare Other | Admitting: Physical Therapy

## 2021-01-06 ENCOUNTER — Other Ambulatory Visit: Payer: Self-pay

## 2021-01-06 ENCOUNTER — Encounter: Payer: Self-pay | Admitting: Physical Therapy

## 2021-01-06 DIAGNOSIS — R279 Unspecified lack of coordination: Secondary | ICD-10-CM

## 2021-01-06 DIAGNOSIS — M6281 Muscle weakness (generalized): Secondary | ICD-10-CM

## 2021-01-06 NOTE — Patient Instructions (Signed)
Pelvic Floor Strengthening:  Do 3x10 Kegels throughout the day  Do 5G38 Quick Flicks throughout the day (contract and relax quickly back to back) Do 3x10 contractions with a 10-15 second hold each time *You can put a hand on your abdomen and one on your bottom when doing these. There shouldn't be large movements here when trying to do Kegels. Kegels should be isolated to the pelvic floor *Do one round of 10 at morning, lunch, and evening.

## 2021-01-06 NOTE — Therapy (Signed)
[facility] @ [place], [place], [address] Phone: [phone]   Fax:  [phone]  Physical Therapy Treatment  Patient Details  Name: Karen Lawrence MRN: [id number] Date of Birth: May 29, 1955 Referring Provider (PT): Dian Queen, MD   Encounter Date: 01/06/2021   PT End of Session - 01/06/21 1144     Visit Number 2    Date for PT Re-Evaluation 04/01/21    Authorization Type Medicare    Progress Note Due on Visit 10    PT Start Time 1101    PT Stop Time 1144    PT Time Calculation (min) 43 min    Activity Tolerance Patient tolerated treatment well    Behavior During Therapy [facility] for tasks assessed/performed             Past Medical History:  Diagnosis Date   Anxiety    pt denies having aniexty.    Breast cancer ([place]) 07/2012   DCIS- Left Breast   History of radiation therapy 11/28/2012-12/25/2012   50.05 Gy to left breast   Hypertension    Personal history of radiation therapy 2014    Past Surgical History:  Procedure Laterality Date   BREAST LUMPECTOMY WITH NEEDLE LOCALIZATION Left 09/11/2012   Procedure: BREAST LUMPECTOMY WITH NEEDLE LOCALIZATION removal of left breast cancer ;  Surgeon: Haywood Lasso, MD;  Location: [facility];  Service: General;  Laterality: Left;   [place] EXTRACTION  2004    There were no vitals filed for this visit.   Subjective Assessment - 01/06/21 1103     Subjective Pt reports better understanding of diaphragmatic breathing now, reports she can see rectal prolapse all the time and reported she did not want PT to assess rectal area or pelvic floor at this time.    How long can you sit comfortably? no limits    How long can you stand comfortably? no limits    How long can you walk comfortably? no limits                               [facility] Adult PT Treatment/Exercise - 01/06/21 0001       Self-Care   Self-Care Other  Self-Care Comments    Other Self-Care Comments  voiding mechanics and breathing mechanics; female anatomy with model used for improved understanding of pelvic contraction and relaxation with muscles as pt did not want to attempt any pelvic floor internal assessment for visual assessment .      Neuro Re-ed    Neuro Re-ed Details  2x10 diaphragmatic breathing in sitting with squatty potty for improved mechanics and techniques without straining or increasing pressure of pelvic floor with improved coordination      Exercises   Exercises Knee/Hip;Lumbar      Lumbar Exercises: Seated   Sit to Stand 20 reps    Sit to Stand Limitations 10# kettlebell with breathing mechanics and core activation      Lumbar Exercises: Supine   Clam 20 reps    Clam Limitations black loop, with diaphragmatic breathing    Bridge 20 reps    Bridge Limitations 2x10    Other Supine Lumbar Exercises hip flexion with black loop 2x10 each side      Lumbar Exercises: Quadruped   Other Quadruped Lumbar Exercises donkey kicks into fire hydrants x10 each  PT Education - 01/06/21 1144     Education Details kegel HEP and female anatomy for pelvic floor contraction/relaxation    Person(s) Educated Patient    Methods Explanation;Demonstration;Tactile cues;Verbal cues;Handout    Comprehension Verbalized understanding;Returned demonstration              PT Short Term Goals - 12/30/20 1331       PT SHORT TERM GOAL #1   Title Pt to be I with HEP    Time 4    Period Weeks    Status New    Target Date 01/27/21      PT SHORT TERM GOAL #2   Title pt to demonstrate at least 3/5 pelvic floor strength to improve prolapse symptoms    Time 4    Period Weeks    Status New    Target Date 01/27/21      PT SHORT TERM GOAL #3   Title pt to demonstrate improved breathing mechanics with activity at pelvic floor contract/relax at least 50% of the time to decrease strain at prolapse    Time 4     Period Weeks    Status New    Target Date 01/27/21               PT Long Term Goals - 12/30/20 1332       PT LONG TERM GOAL #1   Title Pt to be I with advanced HEP    Time 8    Period Weeks    Status New    Target Date 02/24/21      PT LONG TERM GOAL #2   Title pt to demonstrate at least 4/5 pelvic floor strength to improve prolapse symptoms    Time 8    Period Weeks    Status New    Target Date 02/24/21      PT LONG TERM GOAL #3   Title pt to demonstrate improved breathing mechanics with activity at pelvic floor contract/relax at least 75% of the time to decrease strain at prolapse    Time 8    Period Weeks    Status New    Target Date 02/24/21      PT LONG TERM GOAL #4   Title pt to demonstrate 5/5 bil global hip strength for improved stability at pelvis    Time 8    Period Weeks    Status New    Target Date 02/24/21                   Plan - 01/06/21 1145     Clinical Impression Statement Pt presents to clinic reporting no pain and no need to strain for BMs and hasn't had prolapse symptoms however with working out sometimes will feel a small amount of pressure at rectum but no pain. Pt educated on breathing mechanics with all exercises throughout session and cues to improve to decrease strain at prolapse. Pt tolerated well. Pt denied any pelvic floor assessment visually or internally and with use of model, pt educated on muscles at perineal body where pt reports she feels most of her symptoms,  how to contract/relax muscles here for strengthening. Pt given HEP for kegels. Pt reported she would like to try this first with retraining and did not want internal assessment. Pt session focused on breathing mechanics with exercises to decrease strain on pelvic floor/prolapse. Pt educated on mechanics and anatomy and HEP for kegel strength for home. Pt would benefit from additional PT  to improve prolapse symptoms and decrease risk of worsening.    Personal Factors  and Comorbidities Time since onset of injury/illness/exacerbation;Comorbidity 1    Comorbidities x2 births (vaginal and c-section)    Examination-Activity Limitations Toileting    Examination-Participation Restrictions Other   BMs   Stability/Clinical Decision Making Stable/Uncomplicated    Rehab Potential Good    PT Frequency 1x / week    PT Duration 8 weeks    PT Treatment/Interventions ADLs/Self Care Home Management;Aquatic Therapy;Functional mobility training;Therapeutic activities;Therapeutic exercise;Neuromuscular re-education;Manual techniques;Patient/family education;Scar mobilization;Passive range of motion    PT Next Visit Plan internal if agreeable, go over handouts    PT Home Exercise Plan [id number]    Consulted and Agree with Plan of Care Patient             Patient will benefit from skilled therapeutic intervention in order to improve the following deficits and impairments:  Decreased coordination, Decreased endurance, Pain, Decreased mobility, Decreased strength, Postural dysfunction, Improper body mechanics, Impaired flexibility  Visit Diagnosis: Lack of coordination  Muscle weakness (generalized)     Problem List Patient Active Problem List   Diagnosis Date Noted   Malignant neoplasm of lower-outer quadrant of female breast ([place]) 10/03/2012   Breast cancer, left breast, DCIS 08/10/2012    Stacy Gardner, PT, DPT 01/07/2211:09 PM   [facility] @ [place], [place], [address] Phone: [phone]   Fax:  [phone]  Name: Karen Lawrence MRN: [id number] Date of Birth: 22-Jan-1956

## 2021-01-13 ENCOUNTER — Encounter: Payer: Medicare Other | Admitting: Physical Therapy

## 2021-01-28 ENCOUNTER — Other Ambulatory Visit: Payer: Self-pay

## 2021-01-28 ENCOUNTER — Encounter: Payer: Self-pay | Admitting: Physical Therapy

## 2021-01-28 ENCOUNTER — Ambulatory Visit: Payer: Medicare Other | Attending: Obstetrics and Gynecology | Admitting: Physical Therapy

## 2021-01-28 DIAGNOSIS — M6281 Muscle weakness (generalized): Secondary | ICD-10-CM | POA: Insufficient documentation

## 2021-01-28 DIAGNOSIS — R279 Unspecified lack of coordination: Secondary | ICD-10-CM | POA: Diagnosis not present

## 2021-01-28 NOTE — Therapy (Signed)
[place] @ [address] [place], [place], [address] Phone: [phone]   Fax:  [phone]  Physical Therapy Treatment  Patient Details  Name: Karen Lawrence MRN: [id number] Date of Birth: 1955-03-22 Referring Provider (PT): Dian Queen, MD   Encounter Date: 01/28/2021   PT End of Session - 01/28/21 1144     Visit Number 3    Date for PT Re-Evaluation 04/01/21    Authorization Type Medicare    Progress Note Due on Visit 10    PT Start Time 1104    PT Stop Time 1145    PT Time Calculation (min) 41 min    Activity Tolerance Patient tolerated treatment well    Behavior During Therapy [facility] for tasks assessed/performed             Past Medical History:  Diagnosis Date   Anxiety    pt denies having aniexty.    Breast cancer ([place]) 07/2012   DCIS- Left Breast   History of radiation therapy 11/28/2012-12/25/2012   50.05 Gy to left breast   Hypertension    Personal history of radiation therapy 2014    Past Surgical History:  Procedure Laterality Date   BREAST LUMPECTOMY WITH NEEDLE LOCALIZATION Left 09/11/2012   Procedure: BREAST LUMPECTOMY WITH NEEDLE LOCALIZATION removal of left breast cancer ;  Surgeon: Haywood Lasso, MD;  Location: [facility];  Service: General;  Laterality: Left;   [place] EXTRACTION  2004    There were no vitals filed for this visit.   Subjective Assessment - 01/28/21 1106     Subjective Pt reports she thinks her prolapse is getting better and she thinks she sees less of it now. And noticies it less.    How long can you sit comfortably? no limits    How long can you stand comfortably? no limits    How long can you walk comfortably? no limits    Patient Stated Goals to have improvement with prolapse    Currently in Pain? No/denies                               [facility] Adult PT Treatment/Exercise - 01/28/21 0001       Exercises    Exercises Lumbar;Knee/Hip      Lumbar Exercises: Stretches   Other Lumbar Stretch Exercise half kneeling hip flexor stretch 2x30s each    Other Lumbar Stretch Exercise childs pose 2x30s, happy baby 2x30s      Lumbar Exercises: Standing   Other Standing Lumbar Exercises palloffs red band 2x10; rotation palloffs red band 2x10      Lumbar Exercises: Supine   Clam 20 reps    Clam Limitations black loop one leg at a time    Bridge 20 reps    Bridge Limitations 2x10 with red band    Other Supine Lumbar Exercises hip flexion with black loop 2x10 each side    Other Supine Lumbar Exercises TA activation 2x10 hooklying      Lumbar Exercises: Quadruped   [place]/Old Horse 10 reps    Other Quadruped Lumbar Exercises donkey kicks into fire hydrants x10 each      Knee/Hip Exercises: Standing   Hip ADduction Strengthening;Both;20 reps    Hip ADduction Limitations 2x10 blue band    Hip Abduction Stengthening;Both;2 sets;10 reps    Abduction Limitations blue band  PT Education - 01/28/21 1144     Education Details Pt educated on HEP updates, all exericses with proper technique and breathing mechanics    Person(s) Educated Patient    Methods Explanation;Demonstration;Tactile cues;Verbal cues;Handout    Comprehension Verbalized understanding;Returned demonstration              PT Short Term Goals - 12/30/20 1331       PT SHORT TERM GOAL #1   Title Pt to be I with HEP    Time 4    Period Weeks    Status New    Target Date 01/27/21      PT SHORT TERM GOAL #2   Title pt to demonstrate at least 3/5 pelvic floor strength to improve prolapse symptoms    Time 4    Period Weeks    Status New    Target Date 01/27/21      PT SHORT TERM GOAL #3   Title pt to demonstrate improved breathing mechanics with activity at pelvic floor contract/relax at least 50% of the time to decrease strain at prolapse    Time 4    Period Weeks    Status New    Target Date  01/27/21               PT Long Term Goals - 12/30/20 1332       PT LONG TERM GOAL #1   Title Pt to be I with advanced HEP    Time 8    Period Weeks    Status New    Target Date 02/24/21      PT LONG TERM GOAL #2   Title pt to demonstrate at least 4/5 pelvic floor strength to improve prolapse symptoms    Time 8    Period Weeks    Status New    Target Date 02/24/21      PT LONG TERM GOAL #3   Title pt to demonstrate improved breathing mechanics with activity at pelvic floor contract/relax at least 75% of the time to decrease strain at prolapse    Time 8    Period Weeks    Status New    Target Date 02/24/21      PT LONG TERM GOAL #4   Title pt to demonstrate 5/5 bil global hip strength for improved stability at pelvis    Time 8    Period Weeks    Status New    Target Date 02/24/21                   Plan - 01/28/21 1144     Clinical Impression Statement Pt presents to clinic reporting she feels she has had improvement with rectal prolapse, continues to deny assessment from PT and reports she monitors this at home and MD as assessed. Pt session focused on hip and core strengthening exercises with HEP updated for pt to continue at home, cues given for breathing mechanics, TA activations, and pelvic floor contractions when needed. Pt reports she understanding this and has been complaint in pelvic contractions at home and voiding mechanics which has helped. Pt tolerated well with minimal rest breaks. Pt would benefit from additional PT to improve prolapse symptoms and decrease risk of worsening.    Personal Factors and Comorbidities Time since onset of injury/illness/exacerbation;Comorbidity 1    Comorbidities x2 births (vaginal and c-section)    Examination-Activity Limitations Toileting    Examination-Participation Restrictions Other   BMs   Stability/Clinical Decision Making Stable/Uncomplicated  Rehab Potential Good    PT Frequency 1x / week    PT Duration 8  weeks    PT Treatment/Interventions ADLs/Self Care Home Management;Aquatic Therapy;Functional mobility training;Therapeutic activities;Therapeutic exercise;Neuromuscular re-education;Manual techniques;Patient/family education;Scar mobilization;Passive range of motion    PT Next Visit Plan internal if agreeable, HEP, kegels    PT Home Exercise Plan [id number]    Consulted and Agree with Plan of Care Patient             Patient will benefit from skilled therapeutic intervention in order to improve the following deficits and impairments:  Decreased coordination, Decreased endurance, Pain, Decreased mobility, Decreased strength, Postural dysfunction, Improper body mechanics, Impaired flexibility  Visit Diagnosis: Lack of coordination  Muscle weakness (generalized)     Problem List Patient Active Problem List   Diagnosis Date Noted   Malignant neoplasm of lower-outer quadrant of female breast ([place]) 10/03/2012   Breast cancer, left breast, DCIS 08/10/2012    Stacy Gardner, PT, DPT 01/28/2210:47 AM   [place] @ [address] [place], [place], [address] Phone: [phone]   Fax:  [phone]  Name: Karen Lawrence MRN: [id number] Date of Birth: March 12, 1955

## 2021-02-01 ENCOUNTER — Other Ambulatory Visit: Payer: Self-pay | Admitting: Family Medicine

## 2021-02-01 DIAGNOSIS — M8580 Other specified disorders of bone density and structure, unspecified site: Secondary | ICD-10-CM

## 2021-02-04 ENCOUNTER — Ambulatory Visit: Payer: Medicare Other | Admitting: Physical Therapy

## 2021-02-04 ENCOUNTER — Other Ambulatory Visit: Payer: Self-pay

## 2021-02-04 DIAGNOSIS — R279 Unspecified lack of coordination: Secondary | ICD-10-CM | POA: Diagnosis not present

## 2021-02-04 DIAGNOSIS — M6281 Muscle weakness (generalized): Secondary | ICD-10-CM

## 2021-02-04 NOTE — Therapy (Signed)
[place] @ [address] [place], [place], [address] Phone: [phone]   Fax:  [phone]  Physical Therapy Treatment  Patient Details  Name: Karen Lawrence MRN: [id number] Date of Birth: 1955/10/22 Referring Provider (PT): Dian Queen, MD   Encounter Date: 02/04/2021   PT End of Session - 02/04/21 1132     Visit Number 4    Date for PT Re-Evaluation 04/01/21    Authorization Type Medicare    Progress Note Due on Visit 10    PT Start Time 1101    PT Stop Time 1142    PT Time Calculation (min) 41 min    Activity Tolerance Patient tolerated treatment well    Behavior During Therapy [facility] for tasks assessed/performed             Past Medical History:  Diagnosis Date   Anxiety    pt denies having aniexty.    Breast cancer ([place]) 07/2012   DCIS- Left Breast   History of radiation therapy 11/28/2012-12/25/2012   50.05 Gy to left breast   Hypertension    Personal history of radiation therapy 2014    Past Surgical History:  Procedure Laterality Date   BREAST LUMPECTOMY WITH NEEDLE LOCALIZATION Left 09/11/2012   Procedure: BREAST LUMPECTOMY WITH NEEDLE LOCALIZATION removal of left breast cancer ;  Surgeon: Haywood Lasso, MD;  Location: [facility];  Service: General;  Laterality: Left;   [place] EXTRACTION  2004    There were no vitals filed for this visit.   Subjective Assessment - 02/04/21 1102     Subjective Pt reports she thinks her prolapse is about the same, has been making progress overall and does notices the prolapse much less than prior to PT, "it's like it's shrinking."    How long can you sit comfortably? no limits    How long can you stand comfortably? no limits    How long can you walk comfortably? no limits    Patient Stated Goals to have improvement with prolapse    Currently in Pain? No/denies                               [facility] Adult PT  Treatment/Exercise - 02/04/21 0001       Lumbar Exercises: Stretches   Other Lumbar Stretch Exercise cat/cow into childs pose 3x30s      Lumbar Exercises: Standing   Other Standing Lumbar Exercises palloffs blue band 2x10; rotation palloffs blue band 2x10      Lumbar Exercises: Seated   Sit to Stand 20 reps    Sit to Stand Limitations 15# kettlebell with breathing mechanics and core activation and PF contraction      Lumbar Exercises: Supine   Pelvic Tilt 20 reps    Clam 20 reps    Clam Limitations black loop one leg at a time    Bridge 20 reps    Bridge Limitations 2x10 with red band for abductions    Other Supine Lumbar Exercises 2x10 opp arm/knee press with exhale; dead bugs with black band 2x10    Other Supine Lumbar Exercises TA activation 2x10 hooklying, benefited from balloon blowing technique to improve mechanics      Lumbar Exercises: Quadruped   Opposite Arm/Leg Raise Right arm/Left leg;Left arm/Right leg;20 reps  PT Short Term Goals - 12/30/20 1331       PT SHORT TERM GOAL #1   Title Pt to be I with HEP    Time 4    Period Weeks    Status New    Target Date 01/27/21      PT SHORT TERM GOAL #2   Title pt to demonstrate at least 3/5 pelvic floor strength to improve prolapse symptoms    Time 4    Period Weeks    Status New    Target Date 01/27/21      PT SHORT TERM GOAL #3   Title pt to demonstrate improved breathing mechanics with activity at pelvic floor contract/relax at least 50% of the time to decrease strain at prolapse    Time 4    Period Weeks    Status New    Target Date 01/27/21               PT Long Term Goals - 12/30/20 1332       PT LONG TERM GOAL #1   Title Pt to be I with advanced HEP    Time 8    Period Weeks    Status New    Target Date 02/24/21      PT LONG TERM GOAL #2   Title pt to demonstrate at least 4/5 pelvic floor strength to improve prolapse symptoms    Time 8    Period Weeks     Status New    Target Date 02/24/21      PT LONG TERM GOAL #3   Title pt to demonstrate improved breathing mechanics with activity at pelvic floor contract/relax at least 75% of the time to decrease strain at prolapse    Time 8    Period Weeks    Status New    Target Date 02/24/21      PT LONG TERM GOAL #4   Title pt to demonstrate 5/5 bil global hip strength for improved stability at pelvis    Time 8    Period Weeks    Status New    Target Date 02/24/21                   Plan - 02/04/21 1132     Clinical Impression Statement Pt presents to clinic reporting she feels like she notices the prolapse less often compared to prior to therapy and has been continuing to implement HEP, breathing mechanics, and voiding mechanics, throughout the day. Pt reports pressure management and squatty potty have been very helpful. Pt also demonstrating improved core activation and breathing mechanics this session with less cues and reports she understands exercises better. Pt session focused on core and hip strengthening with pressure management and pelvic alignment for prolapse. Pt tolerated well with cues. Pt denied internal assessment or external assessment of prolapse. Pt would benefit from additional PT to improve prolapse symptoms and decrease risk of worsening.    Personal Factors and Comorbidities Time since onset of injury/illness/exacerbation;Comorbidity 1    Comorbidities x2 births (vaginal and c-section)    Examination-Activity Limitations Toileting    Examination-Participation Restrictions Other   BMs   Stability/Clinical Decision Making Stable/Uncomplicated    Rehab Potential Good    PT Frequency 1x / week    PT Duration 8 weeks    PT Treatment/Interventions ADLs/Self Care Home Management;Aquatic Therapy;Functional mobility training;Therapeutic activities;Therapeutic exercise;Neuromuscular re-education;Manual techniques;Patient/family education;Scar mobilization;Passive range of  motion    PT Next Visit Plan pressure  management and pelvic floor activation with daily tasks    PT Home Exercise Plan [id number]    Consulted and Agree with Plan of Care Patient             Patient will benefit from skilled therapeutic intervention in order to improve the following deficits and impairments:  Decreased coordination, Decreased endurance, Pain, Decreased mobility, Decreased strength, Postural dysfunction, Improper body mechanics, Impaired flexibility  Visit Diagnosis: Lack of coordination  Muscle weakness (generalized)     Problem List Patient Active Problem List   Diagnosis Date Noted   Malignant neoplasm of lower-outer quadrant of female breast ([place]) 10/03/2012   Breast cancer, left breast, DCIS 08/10/2012   Stacy Gardner, PT, DPT 02/04/2210:43 AM   [place] @ [address] [place], [place], [address] Phone: [phone]   Fax:  [phone]  Name: LATAVIA GOGA MRN: [id number] Date of Birth: 1955-06-11

## 2021-02-11 ENCOUNTER — Ambulatory Visit: Payer: Medicare Other | Admitting: Physical Therapy

## 2021-02-11 ENCOUNTER — Other Ambulatory Visit: Payer: Self-pay

## 2021-02-11 DIAGNOSIS — R279 Unspecified lack of coordination: Secondary | ICD-10-CM | POA: Diagnosis not present

## 2021-02-11 DIAGNOSIS — M6281 Muscle weakness (generalized): Secondary | ICD-10-CM

## 2021-02-11 NOTE — Therapy (Signed)
[place] @ [address] [place], [place], [address] Phone: [phone]   Fax:  [phone]  Physical Therapy Treatment  Patient Details  Name: Karen Lawrence MRN: [id number] Date of Birth: [birth year] Referring Provider (PT): Dian Queen, MD   Encounter Date: 02/11/2021   PT End of Session - 02/11/21 1158     Visit Number 5    Date for PT Re-Evaluation 04/01/21    Authorization Type Medicare    Progress Note Due on Visit 10    PT Start Time 1152   pt arrival time   PT Stop Time 1230    PT Time Calculation (min) 38 min    Activity Tolerance Patient tolerated treatment well    Behavior During Therapy [facility] for tasks assessed/performed             Past Medical History:  Diagnosis Date   Anxiety    pt denies having aniexty.    Breast cancer ([place]) 07/2012   DCIS- Left Breast   History of radiation therapy 11/28/2012-12/25/2012   50.05 Gy to left breast   Hypertension    Personal history of radiation therapy 2014    Past Surgical History:  Procedure Laterality Date   BREAST LUMPECTOMY WITH NEEDLE LOCALIZATION Left 09/11/2012   Procedure: BREAST LUMPECTOMY WITH NEEDLE LOCALIZATION removal of left breast cancer ;  Surgeon: Haywood Lasso, MD;  Location: [facility];  Service: General;  Laterality: Left;   [place] EXTRACTION  2004    There were no vitals filed for this visit.   Subjective Assessment - 02/11/21 1153     Subjective Pt reports she continues to feel improvements with prolapse, and is not feeling it as much.    How long can you sit comfortably? no limits    How long can you stand comfortably? no limits    How long can you walk comfortably? no limits    Patient Stated Goals to have improvement with prolapse                               [facility] Adult PT Treatment/Exercise - 02/11/21 0001       Self-Care   Self-Care Other Self-Care Comments    Other  Self-Care Comments  Pt educated on prolapse relaxing positions at end of day with handout given and explained, pt denied questions and verbalized understanding.      Exercises   Exercises Lumbar;Knee/Hip      Lumbar Exercises: Standing   Other Standing Lumbar Exercises rotation palloffs blue band 2x10; mario punches 2x10 8#DB    Other Standing Lumbar Exercises blue band diagonal pull opp arm/knee x10 each      Lumbar Exercises: Seated   Sit to Stand 20 reps    Sit to Stand Limitations 20# kettlebell with breathing mechanics and core activation and PF contraction      Lumbar Exercises: Supine   Pelvic Tilt 20 reps    Clam 20 reps    Clam Limitations black loop one leg at a time    Bridge 20 reps    Bridge Limitations 2x10 with black loppfor abductions    Other Supine Lumbar Exercises 2x10 opp arm/knee press with exhale; dead bugs with black band 2x10      Lumbar Exercises: Quadruped   Opposite Arm/Leg Raise Right arm/Left leg;Left arm/Right leg;20 reps   with core activations  Other Quadruped Lumbar Exercises donkey kicks into fire hydrants x20 each                     PT Education - 02/11/21 1158     Education Details Pt educated on proper techniques for all exercises with breathing and pelvic floor coordination    Person(s) Educated Patient    Methods Explanation;Demonstration;Tactile cues;Verbal cues    Comprehension Verbalized understanding;Returned demonstration              PT Short Term Goals - 12/30/20 1331       PT SHORT TERM GOAL #1   Title Pt to be I with HEP    Time 4    Period Weeks    Status New    Target Date 01/27/21      PT SHORT TERM GOAL #2   Title pt to demonstrate at least 3/5 pelvic floor strength to improve prolapse symptoms    Time 4    Period Weeks    Status New    Target Date 01/27/21      PT SHORT TERM GOAL #3   Title pt to demonstrate improved breathing mechanics with activity at pelvic floor contract/relax at least 50%  of the time to decrease strain at prolapse    Time 4    Period Weeks    Status New    Target Date 01/27/21               PT Long Term Goals - 12/30/20 1332       PT LONG TERM GOAL #1   Title Pt to be I with advanced HEP    Time 8    Period Weeks    Status New    Target Date 02/24/21      PT LONG TERM GOAL #2   Title pt to demonstrate at least 4/5 pelvic floor strength to improve prolapse symptoms    Time 8    Period Weeks    Status New    Target Date 02/24/21      PT LONG TERM GOAL #3   Title pt to demonstrate improved breathing mechanics with activity at pelvic floor contract/relax at least 75% of the time to decrease strain at prolapse    Time 8    Period Weeks    Status New    Target Date 02/24/21      PT LONG TERM GOAL #4   Title pt to demonstrate 5/5 bil global hip strength for improved stability at pelvis    Time 8    Period Weeks    Status New    Target Date 02/24/21                   Plan - 02/11/21 1159     Clinical Impression Statement Pt presents to [facility] reporting she continues to feel like her prolapse is shrinking and notices it less often. Pt also reports she feels that use of squatty potty has helped a lot with BMs and no straining and able to fully empty. Pt does continue to deny PT to assess this formally with visual assessment or internal assessment. Pt reports she has been monitoring prolapse for awhile now and with PT strengthening and learning how to breath with exercises she has seen improvement. Pt session focused on hip and core strengthening with proper coordination of pelvic floor and breathing to decrease strain on prolapse, strengthen pelvic floor, and improving positioning for rectal prolapse. Pt  continues to demonstrate need for PT to improve mechanics with activity for pressure management and strengthening for pelvic stability.    Personal Factors and Comorbidities Time since onset of injury/illness/exacerbation;Comorbidity 1     Comorbidities x2 births (vaginal and c-section)    Examination-Activity Limitations Toileting    Examination-Participation Restrictions Other   BMs   Stability/Clinical Decision Making Stable/Uncomplicated    Rehab Potential Good    PT Frequency 1x / week    PT Duration 8 weeks    PT Treatment/Interventions ADLs/Self Care Home Management;Aquatic Therapy;Functional mobility training;Therapeutic activities;Therapeutic exercise;Neuromuscular re-education;Manual techniques;Patient/family education;Scar mobilization;Passive range of motion    PT Next Visit Plan pressure management and pelvic floor activation with daily tasks    PT Home Exercise Plan [id number]    Consulted and Agree with Plan of Care Patient             Patient will benefit from skilled therapeutic intervention in order to improve the following deficits and impairments:  Decreased coordination, Decreased endurance, Pain, Decreased mobility, Decreased strength, Postural dysfunction, Improper body mechanics, Impaired flexibility  Visit Diagnosis: Muscle weakness (generalized)  Lack of coordination     Problem List Patient Active Problem List   Diagnosis Date Noted   Malignant neoplasm of lower-outer quadrant of female breast ([place]) 10/03/2012   Breast cancer, left breast, DCIS 08/10/2012    Stacy Gardner, PT, DPT 02/12/2211:38 PM   [facility] @ [address] [place], [place], [address] Phone: [phone]   Fax:  [phone]  Name: Karen Lawrence MRN: [id number] Date of Birth: 25-Jul-1955

## 2021-02-18 ENCOUNTER — Encounter: Payer: Self-pay | Admitting: Physical Therapy

## 2021-02-18 ENCOUNTER — Ambulatory Visit: Payer: Medicare Other | Admitting: Physical Therapy

## 2021-02-18 ENCOUNTER — Other Ambulatory Visit: Payer: Self-pay

## 2021-02-18 DIAGNOSIS — R279 Unspecified lack of coordination: Secondary | ICD-10-CM | POA: Diagnosis not present

## 2021-02-18 DIAGNOSIS — M6281 Muscle weakness (generalized): Secondary | ICD-10-CM

## 2021-02-18 NOTE — Therapy (Signed)
[facility] @ [place], [place], [address] Phone: [phone]   Fax:  [phone]  Physical Therapy Treatment  Patient Details  Name: Karen Lawrence MRN: [id number] Date of Birth: 12/24/1955 Referring Provider (PT): Dian Queen, MD   Encounter Date: 02/18/2021   PT End of Session - 02/18/21 1119     Visit Number 6    Date for PT Re-Evaluation 04/01/21    Authorization Type Medicare    Progress Note Due on Visit 10    PT Start Time 1104    PT Stop Time 1143    PT Time Calculation (min) 39 min    Activity Tolerance Patient tolerated treatment well    Behavior During Therapy [facility] for tasks assessed/performed             Past Medical History:  Diagnosis Date   Anxiety    pt denies having aniexty.    Breast cancer ([place]) 07/2012   DCIS- Left Breast   History of radiation therapy 11/28/2012-12/25/2012   50.05 Gy to left breast   Hypertension    Personal history of radiation therapy 2014    Past Surgical History:  Procedure Laterality Date   BREAST LUMPECTOMY WITH NEEDLE LOCALIZATION Left 09/11/2012   Procedure: BREAST LUMPECTOMY WITH NEEDLE LOCALIZATION removal of left breast cancer ;  Surgeon: Haywood Lasso, MD;  Location: [facility];  Service: General;  Laterality: Left;   [place] EXTRACTION  2004    There were no vitals filed for this visit.   Subjective Assessment - 02/18/21 1105     Subjective Pt reports I feel 70% better since starting PT, she doesn't feel the prolapse as much or as often and reports she thinks it is returning more to place.    How long can you sit comfortably? no limits    How long can you stand comfortably? no limits    How long can you walk comfortably? no limits    Patient Stated Goals to have improvement with prolapse    Currently in Pain? No/denies                               [facility] Adult PT Treatment/Exercise -  02/18/21 0001       Exercises   Exercises Lumbar;Knee/Hip      Lumbar Exercises: Standing   Other Standing Lumbar Exercises palloffs blue band 2x10 each; rotation palloffs blue band 2x10; mario punches 2x10 8#DB    Other Standing Lumbar Exercises blue band diagonal pull opp arm/knee x10 each      Lumbar Exercises: Supine   Pelvic Tilt 20 reps    Clam 20 reps    Clam Limitations black loop one leg at a time    Bridge 20 reps    Other Supine Lumbar Exercises 2x10 opp arm/knee press with exhale      Lumbar Exercises: Quadruped   Opposite Arm/Leg Raise Right arm/Left leg;Left arm/Right leg;20 reps   with core activations   Other Quadruped Lumbar Exercises donkey kicks into fire hydrants x20 each                     PT Education - 02/18/21 1118     Education Details Pt educated on coordination with pelvic floor and breathing for all activities and voiding mechanics    Person(s) Educated Patient    Methods  Explanation;Demonstration;Tactile cues;Verbal cues    Comprehension Returned demonstration;Verbalized understanding              PT Short Term Goals - 02/18/21 1142       PT SHORT TERM GOAL #1   Title Pt to be I with HEP    Time 4    Period Weeks    Status Achieved    Target Date 01/27/21      PT SHORT TERM GOAL #2   Title pt to demonstrate at least 3/5 pelvic floor strength to improve prolapse symptoms    Time 4    Period Weeks    Status Unable to assess   pt denied to have internal assessment or external visual assessment of pelvic floor   Target Date 01/27/21      PT SHORT TERM GOAL #3   Title pt to demonstrate improved breathing mechanics with activity at pelvic floor contract/relax at least 50% of the time to decrease strain at prolapse    Time 4    Period Weeks    Status Achieved    Target Date 01/27/21               PT Long Term Goals - 02/18/21 1143       PT LONG TERM GOAL #1   Title Pt to be I with advanced HEP    Time 8     Period Weeks    Status Achieved    Target Date 02/24/21      PT LONG TERM GOAL #2   Title pt to demonstrate at least 4/5 pelvic floor strength to improve prolapse symptoms    Time 8    Period Weeks    Status Unable to assess   pt denied to have internal assessment or external visual assessment of pelvic floor   Target Date 02/24/21      PT LONG TERM GOAL #3   Title pt to demonstrate improved breathing mechanics with activity at pelvic floor contract/relax at least 75% of the time to decrease strain at prolapse    Time 8    Period Weeks    Status Achieved    Target Date 02/24/21      PT LONG TERM GOAL #4   Title pt to demonstrate 5/5 bil global hip strength for improved stability at pelvis    Time 8    Period Weeks    Status Achieved    Target Date 02/24/21                   Plan - 02/18/21 1119     Clinical Impression Statement Pt presents to clinic reporting she is very pleased with progress with PT and agreeable for today to be last session for DC. Pt reports she feels her prolapse has gotten at least 70% better since starting PT and understands how to manage pressure better with activity and progress self without worsening symptoms. Pt tolerated session well with focus on strength training with resistance in all directions for core and hips with pressure management and pelvic floor and breathing coordination. Pt denied additional needs and questions at end of session and pleased with progress.    Personal Factors and Comorbidities Time since onset of injury/illness/exacerbation;Comorbidity 1    Comorbidities x2 births (vaginal and c-section)    Examination-Activity Limitations Toileting    Examination-Participation Restrictions Other   BMs   Stability/Clinical Decision Making Stable/Uncomplicated    Rehab Potential Good    PT Frequency 1x /  week    PT Duration 8 weeks    PT Treatment/Interventions ADLs/Self Care Home Management;Aquatic Therapy;Functional mobility  training;Therapeutic activities;Therapeutic exercise;Neuromuscular re-education;Manual techniques;Patient/family education;Scar mobilization;Passive range of motion    PT Next Visit Plan --    PT Home Exercise Plan [id number]    Consulted and Agree with Plan of Care Patient             Patient will benefit from skilled therapeutic intervention in order to improve the following deficits and impairments:  Decreased coordination, Decreased endurance, Pain, Decreased mobility, Decreased strength, Postural dysfunction, Improper body mechanics, Impaired flexibility  Visit Diagnosis: Muscle weakness (generalized)  Lack of coordination     Problem List Patient Active Problem List   Diagnosis Date Noted   Malignant neoplasm of lower-outer quadrant of female breast ([place]) 10/03/2012   Breast cancer, left breast, DCIS 08/10/2012    PHYSICAL THERAPY DISCHARGE SUMMARY  Visits from Start of Care: 6  Current functional level related to goals / functional outcomes: Pt has achieved all STG and LTG except internal pelvic floor strength as pt denied to have this tested therefore unable to assess strength.    Remaining deficits: Pt reports she is at least 70% better since starting PT and does not feel rectal prolapse as often or as severely and thinks it has gotten much smaller in size per pt.    Education / Equipment: HEP   Patient agrees to discharge. Patient goals were  met except internal strength goals . Patient is being discharged due to being pleased with the current functional level. Thank you for the referral.    Junie Panning, PT 02/18/2021, 12:25 PM  [facility] @ [place], [place], [address] Phone: [phone]   Fax:  [phone]  Name: MACEE VENABLES MRN: [id number] Date of Birth: 03/22/1955

## 2021-04-06 ENCOUNTER — Other Ambulatory Visit: Payer: Self-pay | Admitting: General Surgery

## 2021-04-06 DIAGNOSIS — Z9889 Other specified postprocedural states: Secondary | ICD-10-CM

## 2021-07-06 ENCOUNTER — Ambulatory Visit
Admission: RE | Admit: 2021-07-06 | Discharge: 2021-07-06 | Disposition: A | Discharge status: Home / Self Care | Payer: Medicare Other | Source: Ambulatory Visit | Attending: Family Medicine | Admitting: Family Medicine

## 2021-07-06 DIAGNOSIS — M8580 Other specified disorders of bone density and structure, unspecified site: Secondary | ICD-10-CM

## 2021-09-24 ENCOUNTER — Ambulatory Visit
Admission: RE | Admit: 2021-09-24 | Discharge: 2021-09-24 | Disposition: A | Discharge status: Home / Self Care | Payer: Medicare Other | Source: Ambulatory Visit | Attending: General Surgery | Admitting: General Surgery

## 2021-09-24 DIAGNOSIS — Z9889 Other specified postprocedural states: Secondary | ICD-10-CM

## 2022-06-20 ENCOUNTER — Other Ambulatory Visit: Payer: Self-pay | Admitting: General Surgery

## 2022-06-20 DIAGNOSIS — Z1231 Encounter for screening mammogram for malignant neoplasm of breast: Secondary | ICD-10-CM

## 2022-07-08 ENCOUNTER — Ambulatory Visit: Payer: Medicare Other | Admitting: Podiatry

## 2022-07-20 ENCOUNTER — Ambulatory Visit (INDEPENDENT_AMBULATORY_CARE_PROVIDER_SITE_OTHER): Payer: Medicare Other | Admitting: Podiatry

## 2022-07-20 ENCOUNTER — Encounter: Payer: Self-pay | Admitting: Podiatry

## 2022-07-20 DIAGNOSIS — Q828 Other specified congenital malformations of skin: Secondary | ICD-10-CM

## 2022-07-20 NOTE — Progress Notes (Signed)
Subjective:   Patient ID: Karen Lawrence, female   DOB: 67 y.o.   MRN: [id number]   HPI Patient presents with painful calluses underneath the left foot that are hard for her to walk on it been bad for a number of months.  Patient does not smoke likes to be active   Review of Systems  All other systems reviewed and are negative.       Objective:  Physical Exam Vitals and nursing note reviewed.  Constitutional:      Appearance: She is well-developed.  Pulmonary:     Effort: Pulmonary effort is normal.  Musculoskeletal:        General: Normal range of motion.  Skin:    General: Skin is warm.  Neurological:     Mental Status: She is alert.     Neurovascular status intact muscle strength was found to be adequate range of motion adequate with keratotic lesion x 3 plantar aspect left with lucent cores painful to direct pressure.  Good digital perfusion well-oriented     Assessment:  Porokeratotic lesion x 3 left     Plan:  Reviewed condition sharp deep debridement of all lesions and instructed on the future and what to do.  If symptoms get worse we will have to consider more aggressive treatment but will continue with the same pattern as needed

## 2022-09-27 ENCOUNTER — Ambulatory Visit
Admission: RE | Admit: 2022-09-27 | Discharge: 2022-09-27 | Disposition: A | Discharge status: Home / Self Care | Payer: Medicare Other | Source: Ambulatory Visit | Attending: General Surgery | Admitting: General Surgery

## 2022-09-27 DIAGNOSIS — Z1231 Encounter for screening mammogram for malignant neoplasm of breast: Secondary | ICD-10-CM

## 2022-09-29 ENCOUNTER — Other Ambulatory Visit: Payer: Self-pay | Admitting: General Surgery

## 2022-09-29 DIAGNOSIS — R928 Other abnormal and inconclusive findings on diagnostic imaging of breast: Secondary | ICD-10-CM

## 2022-10-03 ENCOUNTER — Ambulatory Visit
Admission: RE | Admit: 2022-10-03 | Discharge: 2022-10-03 | Disposition: A | Discharge status: Home / Self Care | Payer: Medicare Other | Source: Ambulatory Visit | Attending: General Surgery | Admitting: General Surgery

## 2022-10-03 DIAGNOSIS — R928 Other abnormal and inconclusive findings on diagnostic imaging of breast: Secondary | ICD-10-CM

## 2022-10-04 ENCOUNTER — Other Ambulatory Visit: Payer: Self-pay | Admitting: General Surgery

## 2022-10-04 DIAGNOSIS — R921 Mammographic calcification found on diagnostic imaging of breast: Secondary | ICD-10-CM

## 2023-04-11 ENCOUNTER — Ambulatory Visit
Admission: RE | Admit: 2023-04-11 | Discharge: 2023-04-11 | Disposition: A | Discharge status: Home / Self Care | Payer: Medicare Other | Source: Ambulatory Visit | Attending: General Surgery | Admitting: General Surgery

## 2023-04-11 DIAGNOSIS — R921 Mammographic calcification found on diagnostic imaging of breast: Secondary | ICD-10-CM

## 2023-04-12 ENCOUNTER — Other Ambulatory Visit: Payer: Self-pay | Admitting: General Surgery

## 2023-04-12 DIAGNOSIS — R921 Mammographic calcification found on diagnostic imaging of breast: Secondary | ICD-10-CM

## 2023-10-10 ENCOUNTER — Ambulatory Visit
Admission: RE | Admit: 2023-10-10 | Discharge: 2023-10-10 | Disposition: A | Discharge status: Home / Self Care | Payer: Medicare Other | Source: Ambulatory Visit | Attending: General Surgery | Admitting: General Surgery

## 2023-10-10 DIAGNOSIS — R921 Mammographic calcification found on diagnostic imaging of breast: Secondary | ICD-10-CM
